# Patient Record
Sex: Female | Born: 1985 | Race: White | Hispanic: No | Marital: Married | Smoking: Never smoker
Health system: Southern US, Community
[De-identification: ages and names within clinical notes are randomized; demographics above are authoritative.]

## PROBLEM LIST (undated history)

## (undated) DIAGNOSIS — F419 Anxiety disorder, unspecified: Secondary | ICD-10-CM

## (undated) DIAGNOSIS — F329 Major depressive disorder, single episode, unspecified: Secondary | ICD-10-CM

## (undated) DIAGNOSIS — F32A Depression, unspecified: Secondary | ICD-10-CM

## (undated) DIAGNOSIS — N871 Moderate cervical dysplasia: Secondary | ICD-10-CM

## (undated) HISTORY — DX: Moderate cervical dysplasia: N87.1

---
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
## 2005-04-19 ENCOUNTER — Other Ambulatory Visit: Admission: RE | Admit: 2005-04-19 | Discharge: 2005-04-19 | Payer: Self-pay | Admitting: Gynecology

## 2006-05-14 ENCOUNTER — Other Ambulatory Visit: Admission: RE | Admit: 2006-05-14 | Discharge: 2006-05-14 | Payer: Self-pay | Admitting: Gynecology

## 2007-02-23 ENCOUNTER — Emergency Department (HOSPITAL_COMMUNITY): Admission: EM | Admit: 2007-02-23 | Discharge: 2007-02-23 | Payer: Self-pay | Admitting: Emergency Medicine

## 2007-08-01 ENCOUNTER — Other Ambulatory Visit: Admission: RE | Admit: 2007-08-01 | Discharge: 2007-08-01 | Payer: Self-pay | Admitting: Gynecology

## 2007-08-08 DIAGNOSIS — N871 Moderate cervical dysplasia: Secondary | ICD-10-CM

## 2007-08-08 HISTORY — DX: Moderate cervical dysplasia: N87.1

## 2007-08-18 HISTORY — PX: CERVICAL BIOPSY  W/ LOOP ELECTRODE EXCISION: SUR135

## 2007-12-01 ENCOUNTER — Other Ambulatory Visit: Admission: RE | Admit: 2007-12-01 | Discharge: 2007-12-01 | Payer: Self-pay | Admitting: Gynecology

## 2008-08-13 ENCOUNTER — Encounter: Payer: Self-pay | Admitting: Women's Health

## 2008-08-13 ENCOUNTER — Other Ambulatory Visit: Admission: RE | Admit: 2008-08-13 | Discharge: 2008-08-13 | Payer: Self-pay | Admitting: Gynecology

## 2008-08-13 ENCOUNTER — Ambulatory Visit: Payer: Self-pay | Admitting: Women's Health

## 2009-03-11 ENCOUNTER — Ambulatory Visit: Payer: Self-pay | Admitting: Gynecology

## 2009-04-29 ENCOUNTER — Ambulatory Visit: Payer: Self-pay | Admitting: Gynecology

## 2009-07-06 ENCOUNTER — Ambulatory Visit (HOSPITAL_COMMUNITY): Admission: RE | Admit: 2009-07-06 | Discharge: 2009-07-06 | Payer: Self-pay | Admitting: Obstetrics and Gynecology

## 2009-12-14 ENCOUNTER — Inpatient Hospital Stay (HOSPITAL_COMMUNITY): Admission: RE | Admit: 2009-12-14 | Discharge: 2009-12-16 | Payer: Self-pay | Admitting: Obstetrics and Gynecology

## 2010-01-23 HISTORY — PX: OTHER SURGICAL HISTORY: SHX169

## 2010-06-20 ENCOUNTER — Emergency Department (HOSPITAL_COMMUNITY)
Admission: EM | Admit: 2010-06-20 | Discharge: 2010-06-20 | Payer: Self-pay | Source: Home / Self Care | Admitting: Emergency Medicine

## 2010-07-26 ENCOUNTER — Emergency Department (HOSPITAL_COMMUNITY)
Admission: EM | Admit: 2010-07-26 | Discharge: 2010-07-26 | Disposition: A | Discharge status: Home / Self Care | Payer: BC Managed Care – PPO | Attending: Emergency Medicine | Admitting: Emergency Medicine

## 2010-07-26 DIAGNOSIS — R109 Unspecified abdominal pain: Secondary | ICD-10-CM | POA: Insufficient documentation

## 2010-09-04 LAB — COMPREHENSIVE METABOLIC PANEL
ALT: 124 U/L — ABNORMAL HIGH (ref 0–35)
AST: 64 U/L — ABNORMAL HIGH (ref 0–37)
Alkaline Phosphatase: 79 U/L (ref 39–117)
Calcium: 9.1 mg/dL (ref 8.4–10.5)
GFR calc Af Amer: >60 mL/min (ref >60)
Glucose, Bld: 103 mg/dL — ABNORMAL HIGH (ref 70–99)
Potassium: 3.5 mEq/L (ref 3.5–5.1)
Sodium: 141 mEq/L (ref 135–145)
Total Protein: 6 g/dL (ref 6.0–8.3)

## 2010-09-04 LAB — URINALYSIS, ROUTINE W REFLEX MICROSCOPIC
Glucose, UA: NEGATIVE mg/dL
Nitrite: NEGATIVE
pH: 7 (ref 5.0–8.0)

## 2010-09-04 LAB — DIFFERENTIAL
Basophils Relative: 0 % (ref 0–1)
Eosinophils Absolute: 0.2 10*3/uL (ref 0.0–0.7)
Eosinophils Relative: 3 % (ref 0–5)
Lymphs Abs: 1.7 10*3/uL (ref 0.7–4.0)
Monocytes Absolute: 0.6 10*3/uL (ref 0.1–1.0)
Monocytes Relative: 8 % (ref 3–12)
Neutrophils Relative %: 64 % (ref 43–77)

## 2010-09-04 LAB — CBC
HCT: 35 % — ABNORMAL LOW (ref 36.0–46.0)
Hemoglobin: 12 g/dL (ref 12.0–15.0)
MCHC: 34.3 g/dL (ref 30.0–36.0)
RDW: 12.4 % (ref 11.5–15.5)
WBC: 6.9 10*3/uL (ref 4.0–10.5)

## 2010-09-10 LAB — CCBB MATERNAL DONOR DRAW

## 2010-09-10 LAB — CBC
HCT: 33.2 % — ABNORMAL LOW (ref 36.0–46.0)
Hemoglobin: 11.5 g/dL — ABNORMAL LOW (ref 12.0–15.0)
MCH: 33.5 pg (ref 26.0–34.0)
MCHC: 34.8 g/dL (ref 30.0–36.0)
MCHC: 35.1 g/dL (ref 30.0–36.0)
MCV: 95.2 fL (ref 78.0–100.0)
MCV: 95.6 fL (ref 78.0–100.0)
Platelets: 162 10*3/uL (ref 150–400)
Platelets: 198 10*3/uL (ref 150–400)
RBC: 2.8 MIL/uL — ABNORMAL LOW (ref 3.87–5.11)
RDW: 12.6 % (ref 11.5–15.5)
RDW: 12.7 % (ref 11.5–15.5)

## 2010-11-10 NOTE — Discharge Summary (Signed)
NAMEJONEISHA, Katherine Quinn                         ACCOUNT NO.:  [id number]   MEDICAL RECORD NO.:  [id number]                   PATIENT TYPE:  INP   LOCATION:  9137                                 FACILITY:  [facility]   PHYSICIAN:  Timothy P. Fontaine, M.D.           DATE OF BIRTH:  Apr 11, 1986   DATE OF ADMISSION:  11/01/2003  DATE OF DISCHARGE:                                 DISCHARGE SUMMARY   DISCHARGE DIAGNOSIS:  Pregnancy at term, delivered.   PROCEDURE:  1. Spontaneous vaginal delivery on Nov 02, 2003.  2. Midline episiotomy with third degree extension, repaired, on Nov 02, 2003.   HOSPITAL COURSE:  A 24 year old G1 P0 at [redacted] weeks gestation who enters in  prodromal labor.  The patient spontaneously progressed to complete  dilatation, subsequently underwent a spontaneous vaginal delivery by Dr.  Lily Peer producing a normal female infant, Apgars of 8 and 8, weight 7  pounds 13 ounces.  The patient had a midline episiotomy with a third degree  extension which were repaired without difficulty.  She was discharged on  postpartum day #2 ambulating well, tolerating a regular diet.  Blood type is  O positive, rubella titer is immune.  Her postpartum hemoglobin is 11.3.  The patient received precautions, instructions, and follow-up, was seen in  the office in 6 weeks.  She received a prescription for Lortab 5.0 #15 for  pain.                                               Timothy P. Audie Box, M.D.    TPF/MEDQ  D:  11/04/2003  T:  11/04/2003  Job:  [id number]

## 2011-04-06 LAB — POCT PREGNANCY, URINE
Operator id: 294591
Preg Test, Ur: NEGATIVE

## 2011-04-06 LAB — URINALYSIS, ROUTINE W REFLEX MICROSCOPIC
Bilirubin Urine: NEGATIVE
Hgb urine dipstick: NEGATIVE
Specific Gravity, Urine: 1.022
Urobilinogen, UA: 1

## 2011-04-06 LAB — WET PREP, GENITAL: Yeast Wet Prep HPF POC: NONE SEEN

## 2011-04-06 LAB — URINE MICROSCOPIC-ADD ON

## 2011-04-06 LAB — GC/CHLAMYDIA PROBE AMP, GENITAL: GC Probe Amp, Genital: NEGATIVE

## 2011-05-20 ENCOUNTER — Emergency Department (HOSPITAL_COMMUNITY): Payer: BC Managed Care – PPO

## 2011-05-20 ENCOUNTER — Emergency Department (HOSPITAL_COMMUNITY)
Admission: EM | Admit: 2011-05-20 | Discharge: 2011-05-20 | Disposition: A | Discharge status: Home / Self Care | Payer: BC Managed Care – PPO | Attending: Emergency Medicine | Admitting: Emergency Medicine

## 2011-05-20 ENCOUNTER — Other Ambulatory Visit: Payer: Self-pay

## 2011-05-20 ENCOUNTER — Encounter (HOSPITAL_COMMUNITY): Payer: Self-pay | Admitting: Emergency Medicine

## 2011-05-20 DIAGNOSIS — H538 Other visual disturbances: Secondary | ICD-10-CM | POA: Insufficient documentation

## 2011-05-20 DIAGNOSIS — F29 Unspecified psychosis not due to a substance or known physiological condition: Secondary | ICD-10-CM | POA: Insufficient documentation

## 2011-05-20 DIAGNOSIS — R51 Headache: Secondary | ICD-10-CM | POA: Insufficient documentation

## 2011-05-20 DIAGNOSIS — H53149 Visual discomfort, unspecified: Secondary | ICD-10-CM | POA: Insufficient documentation

## 2011-05-20 DIAGNOSIS — E860 Dehydration: Secondary | ICD-10-CM | POA: Insufficient documentation

## 2011-05-20 DIAGNOSIS — R404 Transient alteration of awareness: Secondary | ICD-10-CM | POA: Insufficient documentation

## 2011-05-20 DIAGNOSIS — K8020 Calculus of gallbladder without cholecystitis without obstruction: Secondary | ICD-10-CM

## 2011-05-20 DIAGNOSIS — R42 Dizziness and giddiness: Secondary | ICD-10-CM | POA: Insufficient documentation

## 2011-05-20 DIAGNOSIS — J029 Acute pharyngitis, unspecified: Secondary | ICD-10-CM | POA: Insufficient documentation

## 2011-05-20 DIAGNOSIS — R1013 Epigastric pain: Secondary | ICD-10-CM | POA: Insufficient documentation

## 2011-05-20 DIAGNOSIS — R110 Nausea: Secondary | ICD-10-CM | POA: Insufficient documentation

## 2011-05-20 DIAGNOSIS — R61 Generalized hyperhidrosis: Secondary | ICD-10-CM | POA: Insufficient documentation

## 2011-05-20 DIAGNOSIS — R55 Syncope and collapse: Secondary | ICD-10-CM | POA: Insufficient documentation

## 2011-05-20 HISTORY — DX: Major depressive disorder, single episode, unspecified: F32.9

## 2011-05-20 HISTORY — DX: Depression, unspecified: F32.A

## 2011-05-20 HISTORY — DX: Anxiety disorder, unspecified: F41.9

## 2011-05-20 LAB — RAPID STREP SCREEN (MED CTR MEBANE ONLY): Streptococcus, Group A Screen (Direct): NEGATIVE

## 2011-05-20 LAB — POCT I-STAT TROPONIN I: Troponin i, poc: 0 ng/mL (ref 0.00–0.08)

## 2011-05-20 LAB — D-DIMER, QUANTITATIVE: D-Dimer, Quant: <0.22 ug/mL-FEU (ref 0.00–0.48)

## 2011-05-20 LAB — BASIC METABOLIC PANEL
BUN: 7 mg/dL (ref 6–23)
CO2: 24 mEq/L (ref 19–32)
Calcium: 8.9 mg/dL (ref 8.4–10.5)
Glucose, Bld: 88 mg/dL (ref 70–99)
Potassium: 3.7 mEq/L (ref 3.5–5.1)
Sodium: 135 mEq/L (ref 135–145)

## 2011-05-20 LAB — DIFFERENTIAL
Eosinophils Absolute: 0 10*3/uL (ref 0.0–0.7)
Eosinophils Relative: 0 % (ref 0–5)
Lymphs Abs: 0.4 10*3/uL — ABNORMAL LOW (ref 0.7–4.0)
Monocytes Absolute: 0.4 10*3/uL (ref 0.1–1.0)
Monocytes Relative: 8 % (ref 3–12)

## 2011-05-20 LAB — HEPATIC FUNCTION PANEL
ALT: 15 U/L (ref 0–35)
AST: 20 U/L (ref 0–37)
Albumin: 3.8 g/dL (ref 3.5–5.2)
Alkaline Phosphatase: 46 U/L (ref 39–117)
Bilirubin, Direct: <0.1 mg/dL (ref 0.0–0.3)
Total Bilirubin: 0.5 mg/dL (ref 0.3–1.2)
Total Protein: 6.8 g/dL (ref 6.0–8.3)

## 2011-05-20 LAB — URINALYSIS, ROUTINE W REFLEX MICROSCOPIC
Glucose, UA: NEGATIVE mg/dL
Hgb urine dipstick: NEGATIVE
Leukocytes, UA: NEGATIVE
Specific Gravity, Urine: 1.014 (ref 1.005–1.030)
pH: 7.5 (ref 5.0–8.0)

## 2011-05-20 LAB — CBC
Hemoglobin: 12.3 g/dL (ref 12.0–15.0)
MCH: 31 pg (ref 26.0–34.0)
MCV: 90.9 fL (ref 78.0–100.0)
RBC: 3.97 MIL/uL (ref 3.87–5.11)

## 2011-05-20 MED ORDER — ONDANSETRON HCL 4 MG/2ML IJ SOLN: 4.0000 mg | Freq: Four times a day (QID) | INTRAMUSCULAR | Status: DC | PRN

## 2011-05-20 MED ORDER — METOCLOPRAMIDE HCL 5 MG/ML IJ SOLN
10.0000 mg | Freq: Once | INTRAMUSCULAR | Status: AC
  Administered 2011-05-20: 10 mg via INTRAVENOUS
  Filled 2011-05-20: qty 2

## 2011-05-20 MED ORDER — SODIUM CHLORIDE 0.9 % IV SOLN
INTRAVENOUS | Status: DC
  Administered 2011-05-20: 13:00:00 via INTRAVENOUS

## 2011-05-20 MED ORDER — SODIUM CHLORIDE 0.9 % IV BOLUS (SEPSIS)
1500.0000 mL | Freq: Once | INTRAVENOUS | Status: AC
  Administered 2011-05-20: 1500 mL via INTRAVENOUS

## 2011-05-20 NOTE — ED Provider Notes (Signed)
History     CSN: [id number] Arrival date & time: 05/20/2011 11:15 AM   First MD Initiated Contact with Patient 05/20/11 1405      Chief Complaint  Patient presents with  . Loss of Consciousness    (Consider location/radiation/quality/duration/timing/severity/associated sxs/prior treatment) HPI  Patient states she felt fine today when she went to church. They were standing for about 10 minutes and she said everybody else felt warm but she felt cold so she put on her jacket she then starts sweating and felt dizzy and the fourchette she passed out. She states she woke up with some blurred vision and feeling shaky and feeling well confused. She relates as they were leaving church to go back to her parents house she started getting a frontal throbbing headache with photophobia but no noise sensitivity. By this point her blurred vision was gone. She also relates she started getting epigastric abdominal pain that feels like her prior gallbladder attacks. She has nausea without vomiting coughing or diarrhea. She states she had a mild sore throat yesterday. She's on her period now the she states she's never passed out before. She's been taking phentermine for the past 3 months this lost 15 pounds. She relates all she ate this morning was a [employer] breakfast bar. She states she has been on phentermine for a year at a time before. She states she knows she has gallstones and has had elevated liver tests before.  Psychiatrist Dr[facility] for the past 3 months  Past Medical History  Diagnosis Date  . Depressed   . Anxiety    gallstones  History reviewed. No pertinent past surgical history.  History reviewed. No pertinent family history.  History  Substance Use Topics  . Smoking status:  no   . Smokeless tobacco: Not on file  . Alcohol Use: No   lives with spouse  OB History    Grav Para Term Preterm Abortions TAB SAB Ect Mult Living                  Review of Systems    All other systems reviewed and are negative.    Allergies  Review of patient's allergies indicates no known allergies.  Home Medications   Current Outpatient Rx  Name Route Sig Dispense Refill  . DIAZEPAM 5 MG PO TABS Oral Take 5 mg by mouth every 6 (six) hours as needed.      Marland Kitchen FLUOXETINE HCL 10 MG PO CAPS Oral Take 10 mg by mouth daily.      Marland Kitchen PHENTERMINE HCL 15 MG PO CAPS Oral Take 15 mg by mouth every morning.        BP 100/60  Pulse 102  Temp(Src) 98.9 F (37.2 C) (Oral)  Resp 20  SpO2 97%  Vital signs normal except for tachycardia orthostatics show persistent tachycardia  Physical Exam  Vitals reviewed. Constitutional: She is oriented to person, place, and time. She appears well-developed and well-nourished.  Non-toxic appearance. She does not appear ill. No distress.  HENT:  Head: Normocephalic and atraumatic.  Right Ear: External ear normal.  Left Ear: External ear normal.  Nose: Nose normal. No mucosal edema or rhinorrhea.  Mouth/Throat: Uvula is midline and mucous membranes are normal. No dental abscesses or uvula swelling. No oropharyngeal exudate, posterior oropharyngeal edema, posterior oropharyngeal erythema or tonsillar abscesses.       Mucous membranes are dry  Eyes: Conjunctivae and EOM are normal. Pupils are equal, round, and reactive to light.  Neck: Normal range of motion and full passive range of motion without pain. Neck supple.  Cardiovascular: Normal rate, regular rhythm and normal heart sounds.  Exam reveals no gallop and no friction rub.   No murmur heard. Pulmonary/Chest: Effort normal and breath sounds normal. No respiratory distress. She has no wheezes. She has no rhonchi. She has no rales. She exhibits no tenderness and no crepitus.  Abdominal: Soft. Normal appearance and bowel sounds are normal. She exhibits no distension. There is no tenderness. There is no rebound and no guarding.  Musculoskeletal: Normal range of motion. She exhibits no  edema and no tenderness.       Moves all extremities well.   Neurological: She is alert and oriented to person, place, and time. She has normal strength. No cranial nerve deficit.  Skin: Skin is warm, dry and intact. No rash noted. No erythema. No pallor.  Psychiatric: Her speech is normal and behavior is normal. Her mood appears not anxious.       Affect is flat    ED Course  Procedures (including critical care time)  Patient was given 1500 cc fluid bolus for her dehydration, tachycardia, and orthostasis. She was given Reglan and Benadryl IV. She relates her abdominal pain and headaches are improved.   Results for orders placed during the hospital encounter of 05/20/11  BASIC METABOLIC PANEL      Component Value Range   Sodium 135  135 - 145 (mEq/L)   Potassium 3.7  3.5 - 5.1 (mEq/L)   Chloride 102  96 - 112 (mEq/L)   CO2 24  19 - 32 (mEq/L)   Glucose, Bld 88  70 - 99 (mg/dL)   BUN 7  6 - 23 (mg/dL)   Creatinine, Ser 1.61  0.50 - 1.10 (mg/dL)   Calcium 8.9  8.4 - 09.6 (mg/dL)   GFR calc non Af Amer >90  >90 (mL/min)   GFR calc Af Amer >90  >90 (mL/min)  CBC      Component Value Range   WBC 5.2  4.0 - 10.5 (K/uL)   RBC 3.97  3.87 - 5.11 (MIL/uL)   Hemoglobin 12.3  12.0 - 15.0 (g/dL)   HCT 04.5  40.9 - 81.1 (%)   MCV 90.9  78.0 - 100.0 (fL)   MCH 31.0  26.0 - 34.0 (pg)   MCHC 34.1  30.0 - 36.0 (g/dL)   RDW 91.4  78.2 - 95.6 (%)   Platelets 117 (*) 150 - 400 (K/uL)  DIFFERENTIAL      Component Value Range   Neutrophils Relative 83 (*) 43 - 77 (%)   Neutro Abs 4.3  1.7 - 7.7 (K/uL)   Lymphocytes Relative 8 (*) 12 - 46 (%)   Lymphs Abs 0.4 (*) 0.7 - 4.0 (K/uL)   Monocytes Relative 8  3 - 12 (%)   Monocytes Absolute 0.4  0.1 - 1.0 (K/uL)   Eosinophils Relative 0  0 - 5 (%)   Eosinophils Absolute 0.0  0.0 - 0.7 (K/uL)   Basophils Relative 0  0 - 1 (%)   Basophils Absolute 0.0  0.0 - 0.1 (K/uL)  URINALYSIS, ROUTINE W REFLEX MICROSCOPIC      Component Value Range   Color,  Urine YELLOW  YELLOW    Appearance CLEAR  CLEAR    Specific Gravity, Urine 1.014  1.005 - 1.030    pH 7.5  5.0 - 8.0    Glucose, UA NEGATIVE  NEGATIVE (mg/dL)   Hgb urine  dipstick NEGATIVE  NEGATIVE    Bilirubin Urine NEGATIVE  NEGATIVE    Ketones, ur NEGATIVE  NEGATIVE (mg/dL)   Protein, ur NEGATIVE  NEGATIVE (mg/dL)   Urobilinogen, UA 1.0  0.0 - 1.0 (mg/dL)   Nitrite NEGATIVE  NEGATIVE    Leukocytes, UA NEGATIVE  NEGATIVE   D-DIMER, QUANTITATIVE      Component Value Range   D-Dimer, Quant <0.22  0.00 - 0.48 (ug/mL-FEU)  RAPID STREP SCREEN      Component Value Range   Streptococcus, Group A Screen (Direct) NEGATIVE  NEGATIVE   GLUCOSE, CAPILLARY      Component Value Range   Glucose-Capillary 78  70 - 99 (mg/dL)  POCT I-STAT TROPONIN I      Component Value Range   Troponin i, poc 0.00  0.00 - 0.08 (ng/mL)   Comment 3           HEPATIC FUNCTION PANEL      Component Value Range   Total Protein 6.8  6.0 - 8.3 (g/dL)   Albumin 3.8  3.5 - 5.2 (g/dL)   AST 20  0 - 37 (U/L)   ALT 15  0 - 35 (U/L)   Alkaline Phosphatase 46  39 - 117 (U/L)   Total Bilirubin 0.5  0.3 - 1.2 (mg/dL)   Bilirubin, Direct <1.6  0.0 - 0.3 (mg/dL)   Indirect Bilirubin NOT CALCULATED  0.3 - 0.9 (mg/dL)  LIPASE, BLOOD      Component Value Range   Lipase 17  11 - 59 (U/L)   Laboratory impression normal  Dg Chest 2 View  05/20/2011  *RADIOLOGY REPORT*  Clinical Data: 25 year old female with sweats and chills.  Syncope.  CHEST - 2 VIEW  Comparison: None  Findings: The cardiomediastinal silhouette is unremarkable. The lungs are clear. There is no evidence of focal airspace disease, pulmonary edema, pulmonary nodule/mass, pleural effusion, or pneumothorax. No acute bony abnormalities are identified.  IMPRESSION: No evidence of active cardiopulmonary disease.  Original Report Authenticated By: Rosendo Gros, M.D.   Ct Head Wo Contrast  05/20/2011  *RADIOLOGY REPORT*  Clinical Data: 26 year old female with  syncope, headache and blurred vision.  CT HEAD WITHOUT CONTRAST  Technique:  Contiguous axial images were obtained from the base of the skull through the vertex without contrast.  Comparison: None  Findings: No intracranial abnormalities are identified, including mass lesion or mass effect, hydrocephalus, extra-axial fluid collection, midline shift, hemorrhage, or acute infarction.  The visualized bony calvarium is unremarkable.  IMPRESSION: Unremarkable noncontrast head CT  Original Report Authenticated By: Rosendo Gros, M.D.     Date: 05/20/2011  Rate: 90  Rhythm: normal sinus rhythm  QRS Axis: normal  Intervals: normal  ST/T Wave abnormalities: nonspecific T wave changes  Conduction Disutrbances:none  Narrative Interpretation:   Old EKG Reviewed: none available    1. Syncope   2. Headache   3. Gallstones   4. Dehydration     Plan discharge  MDM          Ward Givens, MD 05/20/11 1600

## 2011-05-20 NOTE — ED Notes (Signed)
Pt. Stated, we were singing in church and I passed out . When i woke up up I had a headache and my stomach hurt really bad

## 2011-05-20 NOTE — ED Notes (Signed)
amb to the bathroom for urine sample

## 2011-05-20 NOTE — ED Provider Notes (Signed)
25yo F, c/o one episode of brief syncope while standing at church P[employer].  States she felt "cold" then "sweaty" and lightheaded.  States "next thing I remember everyone was asking me if I was ok."  Has had recent mild sore throat.  No other complaints.  NAD, A&O, resps easy, CTA, RRR, abd soft/NT, neuro non-focal.  I saw this patient for screening purposes.  Pt is stable, but further workup and evaluation is needed beyond triage capabilities.   Laray Anger, DO 05/20/11 1203

## 2011-05-21 LAB — POCT PREGNANCY, URINE: Preg Test, Ur: NEGATIVE

## 2011-06-12 ENCOUNTER — Encounter (HOSPITAL_COMMUNITY): Payer: Self-pay | Admitting: Emergency Medicine

## 2011-06-12 ENCOUNTER — Emergency Department (HOSPITAL_COMMUNITY)
Admission: EM | Admit: 2011-06-12 | Discharge: 2011-06-12 | Disposition: A | Discharge status: Home / Self Care | Payer: BC Managed Care – PPO | Attending: Emergency Medicine | Admitting: Emergency Medicine

## 2011-06-12 DIAGNOSIS — T424X4A Poisoning by benzodiazepines, undetermined, initial encounter: Secondary | ICD-10-CM | POA: Insufficient documentation

## 2011-06-12 DIAGNOSIS — N390 Urinary tract infection, site not specified: Secondary | ICD-10-CM | POA: Insufficient documentation

## 2011-06-12 DIAGNOSIS — T43502A Poisoning by unspecified antipsychotics and neuroleptics, intentional self-harm, initial encounter: Secondary | ICD-10-CM | POA: Insufficient documentation

## 2011-06-12 DIAGNOSIS — T438X2A Poisoning by other psychotropic drugs, intentional self-harm, initial encounter: Secondary | ICD-10-CM | POA: Insufficient documentation

## 2011-06-12 DIAGNOSIS — IMO0002 Reserved for concepts with insufficient information to code with codable children: Secondary | ICD-10-CM

## 2011-06-12 DIAGNOSIS — F341 Dysthymic disorder: Secondary | ICD-10-CM | POA: Insufficient documentation

## 2011-06-12 DIAGNOSIS — Z79899 Other long term (current) drug therapy: Secondary | ICD-10-CM | POA: Insufficient documentation

## 2011-06-12 LAB — DIFFERENTIAL
Basophils Absolute: 0 10*3/uL (ref 0.0–0.1)
Basophils Relative: 0 % (ref 0–1)
Eosinophils Relative: 1 % (ref 0–5)
Lymphocytes Relative: 25 % (ref 12–46)
Monocytes Absolute: 0.7 10*3/uL (ref 0.1–1.0)

## 2011-06-12 LAB — CBC
MCHC: 35 g/dL (ref 30.0–36.0)
MCV: 89.3 fL (ref 78.0–100.0)
Platelets: 172 10*3/uL (ref 150–400)
RDW: 12.2 % (ref 11.5–15.5)
WBC: 8.4 10*3/uL (ref 4.0–10.5)

## 2011-06-12 LAB — RAPID URINE DRUG SCREEN, HOSP PERFORMED
Amphetamines: NOT DETECTED
Benzodiazepines: POSITIVE — AB
Cocaine: NOT DETECTED
Opiates: NOT DETECTED

## 2011-06-12 LAB — URINALYSIS, ROUTINE W REFLEX MICROSCOPIC
Bilirubin Urine: NEGATIVE
Glucose, UA: NEGATIVE mg/dL
Specific Gravity, Urine: 1.007 (ref 1.005–1.030)
Urobilinogen, UA: 1 mg/dL (ref 0.0–1.0)
pH: 6 (ref 5.0–8.0)

## 2011-06-12 LAB — URINE MICROSCOPIC-ADD ON

## 2011-06-12 LAB — ETHANOL: Alcohol, Ethyl (B): <11 mg/dL (ref 0–11)

## 2011-06-12 LAB — COMPREHENSIVE METABOLIC PANEL
ALT: 9 U/L (ref 0–35)
AST: 10 U/L (ref 0–37)
Albumin: 3.4 g/dL — ABNORMAL LOW (ref 3.5–5.2)
CO2: 25 mEq/L (ref 19–32)
Calcium: 8.3 mg/dL — ABNORMAL LOW (ref 8.4–10.5)
Creatinine, Ser: 0.66 mg/dL (ref 0.50–1.10)
GFR calc non Af Amer: >90 mL/min (ref >90)
Sodium: 137 mEq/L (ref 135–145)
Total Protein: 5.8 g/dL — ABNORMAL LOW (ref 6.0–8.3)

## 2011-06-12 LAB — ACETAMINOPHEN LEVEL: Acetaminophen (Tylenol), Serum: <15 ug/mL (ref 10–30)

## 2011-06-12 LAB — PREGNANCY, URINE: Preg Test, Ur: NEGATIVE

## 2011-06-12 MED ORDER — NITROFURANTOIN MONOHYD MACRO 100 MG PO CAPS: 100.0000 mg | ORAL_CAPSULE | Freq: Two times a day (BID) | ORAL | Status: AC

## 2011-06-12 NOTE — ED Notes (Signed)
WUJ:WJXB<JY> Expected date:06/12/11<BR> Expected time:12:51 AM<BR> Means of arrival:Ambulance<BR> Comments:<BR> EMS [facility] GC, 25 yof OD on valium, domestic

## 2011-06-12 NOTE — ED Provider Notes (Signed)
History     CSN: [id number] Arrival date & time: 06/12/2011  1:18 AM   First MD Initiated Contact with Patient 06/12/11 0206      Chief Complaint  Patient presents with  . Drug Overdose    pt sattes she took approx 15 pills    (Consider location/radiation/quality/duration/timing/severity/associated sxs/prior treatment) The history is provided by the patient.   She states that she was with her husband when she took 61. Valium pills in anger at him. They have been going through separation and ultimately will divorce based on infideility.  When seen in the emergency department, she was remorseful, nonsuicidal, and cooperative. During the time before I saw her she did not vomit, was not sleeping, and had normal vital signs. Patient has never had a prior suicide attempt. She is seeing a psychiatrist regularly for depression. She is currently living with a young child, her husband, and working daily. She has not had any recent illness, headache, back pain, difficulty walking.  Past Medical History  Diagnosis Date  . Depressed   . Anxiety     No past surgical history on file.  No family history on file.  History  Substance Use Topics  . Smoking status: Not on file  . Smokeless tobacco: Not on file  . Alcohol Use: No    OB History    Grav Para Term Preterm Abortions TAB SAB Ect Mult Living                  Review of Systems  All other systems reviewed and are negative.    Allergies  Review of patient's allergies indicates no known allergies.  Home Medications   Current Outpatient Rx  Name Route Sig Dispense Refill  . DIAZEPAM 5 MG PO TABS Oral Take 5 mg by mouth every 6 (six) hours as needed. Anxiety    . FLUOXETINE HCL 10 MG PO CAPS Oral Take 10 mg by mouth daily.     Marland Kitchen PHENTERMINE HCL 15 MG PO CAPS Oral Take 15 mg by mouth every morning.      Marland Kitchen NITROFURANTOIN MONOHYD MACRO 100 MG PO CAPS Oral Take 1 capsule (100 mg total) by mouth 2 (two) times daily. 10 capsule 0      BP 100/57  Pulse 76  Temp(Src) 98.6 F (37 C) (Oral)  Resp 16  SpO2 100%  Physical Exam  Constitutional: She is oriented to person, place, and time. She appears well-developed and well-nourished.  HENT:  Head: Normocephalic and atraumatic.  Eyes: Conjunctivae are normal. Pupils are equal, round, and reactive to light.  Neck: Normal range of motion. Neck supple.  Cardiovascular: Normal rate and regular rhythm.   Pulmonary/Chest: Effort normal and breath sounds normal.  Abdominal: Soft. Bowel sounds are normal.  Musculoskeletal: Normal range of motion.  Neurological: She is alert and oriented to person, place, and time. No cranial nerve deficit. She exhibits normal muscle tone. Coordination normal.  Skin: Skin is warm and dry.  Psychiatric: She has a normal mood and affect. Her behavior is normal. Judgment and thought content normal.    ED Course  Procedures (including critical care time) The patient was asked to, and agreed to sign a no harm contract. She states that she will be going to work today and will follow up with her psychiatrist.  Labs Reviewed  CBC - Abnormal; Notable for the following:    RBC 3.55 (*)    Hemoglobin 11.1 (*)    HCT 31.7 (*)  All other components within normal limits  COMPREHENSIVE METABOLIC PANEL - Abnormal; Notable for the following:    Potassium 3.0 (*)    Calcium 8.3 (*)    Total Protein 5.8 (*)    Albumin 3.4 (*)    All other components within normal limits  URINALYSIS, ROUTINE W REFLEX MICROSCOPIC - Abnormal; Notable for the following:    APPearance CLOUDY (*)    Hgb urine dipstick SMALL (*)    Nitrite POSITIVE (*)    Leukocytes, UA LARGE (*)    All other components within normal limits  URINE RAPID DRUG SCREEN (HOSP PERFORMED) - Abnormal; Notable for the following:    Benzodiazepines POSITIVE (*)    All other components within normal limits  SALICYLATE LEVEL - Abnormal; Notable for the following:    Salicylate Lvl <2.0 (*)     All other components within normal limits  URINE MICROSCOPIC-ADD ON - Abnormal; Notable for the following:    Squamous Epithelial / LPF MANY (*)    Bacteria, UA MANY (*)    All other components within normal limits  DIFFERENTIAL  PREGNANCY, URINE  ETHANOL  ACETAMINOPHEN LEVEL  URINE CULTURE   No results found.   1. Drug ingestion   2. UTI (lower urinary tract infection)       MDM  Nontoxic ingestion, related to social stress. Patient is nonsuicidal in the emergency department. She has access to followup care with her psychiatrist. She has incidental urinary tract infection that can be treated as an outpatient.        Flint Melter, MD 06/12/11 [phone]

## 2011-06-12 NOTE — ED Notes (Signed)
Pt states that she found a number in her husband's pants and she drove to the address to find husband at a female's house. Pt states at that time she was suicidal and wanted to hurt herself. Pt states she took approx 15 diazepam's. Pt states "it was just I was thinking clearly when I saw him there, I don't want to kill myself, I have my children to live for". Pt clothes, medicines, and other belongings removed and placed at nursing station.

## 2011-06-14 LAB — URINE CULTURE: Culture  Setup Time: 201212180959

## 2011-06-15 NOTE — ED Notes (Signed)
+   urine Patient treated with Macrobid-sensitive to same-chart appended per protocol MD. 

## 2011-10-26 ENCOUNTER — Other Ambulatory Visit (HOSPITAL_COMMUNITY)
Admission: RE | Admit: 2011-10-26 | Discharge: 2011-10-26 | Disposition: A | Discharge status: Home / Self Care | Payer: BC Managed Care – PPO | Source: Ambulatory Visit | Attending: Gynecology | Admitting: Gynecology

## 2011-10-26 ENCOUNTER — Ambulatory Visit (INDEPENDENT_AMBULATORY_CARE_PROVIDER_SITE_OTHER): Payer: BC Managed Care – PPO | Admitting: Gynecology

## 2011-10-26 ENCOUNTER — Encounter: Payer: Self-pay | Admitting: Gynecology

## 2011-10-26 VITALS — BP 110/68 | Ht 61.0 in | Wt 137.0 lb

## 2011-10-26 DIAGNOSIS — N926 Irregular menstruation, unspecified: Secondary | ICD-10-CM

## 2011-10-26 DIAGNOSIS — Z01419 Encounter for gynecological examination (general) (routine) without abnormal findings: Secondary | ICD-10-CM | POA: Insufficient documentation

## 2011-10-26 DIAGNOSIS — M79602 Pain in left arm: Secondary | ICD-10-CM

## 2011-10-26 DIAGNOSIS — N921 Excessive and frequent menstruation with irregular cycle: Secondary | ICD-10-CM

## 2011-10-26 DIAGNOSIS — Z975 Presence of (intrauterine) contraceptive device: Secondary | ICD-10-CM

## 2011-10-26 DIAGNOSIS — M79609 Pain in unspecified limb: Secondary | ICD-10-CM

## 2011-10-26 DIAGNOSIS — N938 Other specified abnormal uterine and vaginal bleeding: Secondary | ICD-10-CM

## 2011-10-26 LAB — CBC WITH DIFFERENTIAL/PLATELET
Eosinophils Relative: 2 % (ref 0–5)
HCT: 37.5 % (ref 36.0–46.0)
Lymphocytes Relative: 40 % (ref 12–46)
Lymphs Abs: 2 10*3/uL (ref 0.7–4.0)
MCV: 92.8 fL (ref 78.0–100.0)
Monocytes Absolute: 0.4 10*3/uL (ref 0.1–1.0)
RBC: 4.04 MIL/uL (ref 3.87–5.11)
RDW: 11.9 % (ref 11.5–15.5)
WBC: 5 10*3/uL (ref 4.0–10.5)

## 2011-10-26 NOTE — Progress Notes (Signed)
Katherine Quinn 02/05/1986 [id number]   History:    26 y.o.  for annual exam who in 2011 after the birth of her last child she had an implant on placed on her left arm in another practice. She states she feels discomfort and that arm which she's leaning on and but no numbness and tingling reported. 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 She frequently does her self breast examination. Next week as scheduled to undergo breast augmentation in several months later abdominoplasty. She's also complaining of irregular periods since she's had the implant on. She did have the Mirena IUD several years ago worked well for her with the exception of some mild cramping.  Past medical history,surgical history, family history and social history were all reviewed and documented in the EPIC chart.  Gynecologic History Patient's last menstrual period was 10/13/2011. Contraception: Nexplanon Last Pap: 2012?Marland Kitchen Results were: normal Last mammogram: Not indicated. Results were: Not indicated  Obstetric History OB History    Grav Para Term Preterm Abortions TAB SAB Ect Mult Living   2 2 2       2      # Outc Date GA Lbr Len/2nd Wgt Sex Del Anes PTL Lv   1 TRM     F SVD  No Yes   2 TRM     M SVD  No Yes       ROS:  Was performed and pertinent positives and negatives are included in the history.  Exam: chaperone present  BP 110/68  Ht 5\' 1"  (1.549 m)  Wt 137 lb (62.143 kg)  BMI 25.89 kg/m2  LMP 10/13/2011  Body mass index is 25.89 kg/(m^2).  General appearance : Well developed well nourished female. No acute distress HEENT: Neck supple, trachea midline, no carotid bruits, no thyroidmegaly Lungs: Clear to auscultation, no rhonchi or wheezes, or rib retractions  Heart: Regular rate and rhythm, no murmurs or gallops Breast:Examined in sitting and supine position were symmetrical in  appearance, no palpable masses or tenderness,  no skin retraction, no nipple inversion, no nipple discharge, no skin discoloration, no axillary or supraclavicular lymphadenopathy Abdomen: no palpable masses or tenderness, no rebound or guarding Extremities: no edema or skin discoloration or tenderness, Nexplanon capsule was noted to be placed subdermally but more posteriorly than usual. Good motor and sensory deficit and pulses on that arm.  Pelvic:  Bartholin, Urethra, Skene Glands: Within normal limits             Vagina: No gross lesions or discharge  Cervix: No gross lesions or discharge  Uterus  anteverted, normal size, shape and consistency, non-tender and mobile  Adnexa  Without masses or tenderness  Anus and perineum  normal   Rectovaginal  normal sphincter tone without palpated masses or tenderness             Hemoccult not done     Assessment/Plan:  26 y.o. female for annual exam with irregular bleeding on the nexplanon and complaining of discomfort on that arm. She will return back to the office in 2 weeks to have the nexplanon removed and we'll replace with an IUD "Skyla" which is good for 3 years. The risks benefits and pros and cons were discussed. She was encouraged to continue to do monthly self breast examination. Her CBC cholesterol urinalysis and Pap smear was done today.    Ok Edwards MD,  9:53 AM 10/26/2011

## 2011-10-26 NOTE — Patient Instructions (Signed)
Intrauterine Device Information An intrauterine device (IUD) is inserted into your uterus and prevents pregnancy. There are 2 types of IUDs available:  Copper IUD. This type of IUD is wrapped in copper wire and is placed inside the uterus. Copper makes the uterus and fallopian tubes produce a fluid that kills sperm. The copper IUD can stay in place for 10 years.   Hormone IUD. This type of IUD contains the hormone progestin (synthetic progesterone). The hormone thickens the cervical mucus and prevents sperm from entering the uterus, and it also thins the uterine lining to prevent implantation of a fertilized egg. The hormone can weaken or kill the sperm that get into the uterus. The hormone IUD can stay in place for 5 years.  Your caregiver will make sure you are a good candidate for a contraceptive IUD. Discuss with your caregiver the possible side effects. ADVANTAGES  It is highly effective, reversible, long-acting, and low maintenance.   There are no estrogen-related side effects.   An IUD can be used when breastfeeding.   It is not associated with weight gain.   It works immediately after insertion.   The copper IUD does not interfere with your female hormones.   The progesterone IUD can make heavy menstrual periods lighter.   The progesterone IUD can be used for 5 years.   The copper IUD can be used for 10 years.  DISADVANTAGES  The progesterone IUD can be associated with irregular bleeding patterns.   The copper IUD can make your menstrual flow heavier and more painful.   You may experience cramping and vaginal bleeding after insertion.  Document Released: 05/15/2004 Document Revised: 05/31/2011 Document Reviewed: 10/14/2010 [employer] Patient Information 2012 [employer]. 

## 2011-10-29 ENCOUNTER — Telehealth: Payer: Self-pay | Admitting: *Deleted

## 2011-10-29 ENCOUNTER — Other Ambulatory Visit: Payer: Self-pay | Admitting: *Deleted

## 2011-10-29 DIAGNOSIS — Z3049 Encounter for surveillance of other contraceptives: Secondary | ICD-10-CM

## 2011-10-29 MED ORDER — LEVONORGESTREL 13.5 MG IU IUD: INTRAUTERINE_SYSTEM | Freq: Once | INTRAUTERINE | Status: DC

## 2011-10-29 NOTE — Telephone Encounter (Signed)
Message copied by Libby Maw on Mon Oct 29, 2011 10:25 AM ------      Message from: Ok Edwards      Created: Fri Oct 26, 2011  9:57 AM       Any, patients going to return to the office in 2 weeks to remove the nexplanon that was placed in another facility. She would like an IUD placed at the same time. I've recommended the Skyla IUD. Please check her insurance verification and have one saved here for her.

## 2011-10-29 NOTE — Telephone Encounter (Signed)
Patient informed all below covered at 100%.

## 2011-11-15 ENCOUNTER — Ambulatory Visit: Payer: BC Managed Care – PPO | Admitting: Gynecology

## 2011-11-16 ENCOUNTER — Ambulatory Visit: Payer: BC Managed Care – PPO | Admitting: Gynecology

## 2012-01-17 ENCOUNTER — Ambulatory Visit: Payer: BC Managed Care – PPO | Admitting: Gynecology

## 2012-03-04 ENCOUNTER — Telehealth: Payer: Self-pay | Admitting: *Deleted

## 2012-03-04 NOTE — Telephone Encounter (Signed)
Pt was called today regarding follow up on IUD that was ordered for her. She has a implanon that needs to be removed and then wanted a IUD ineserted. I left a message for pt to call me back KW

## 2012-03-07 NOTE — Telephone Encounter (Signed)
Pt hasnt called back so I will close encounter today KW

## 2012-08-10 IMAGING — CT CT HEAD W/O CM
1 of 2 series · 13 of 30 positions shown, 17 images · non-contrast
Comparison: None

CLINICAL DATA: 25-year-old female with syncope, headache and
blurred vision.

CT HEAD WITHOUT CONTRAST
TECHNIQUE: Contiguous axial images were obtained from the base of
the skull through the vertex without contrast.

[Series 2: brain · axial · 0.47mm/px · z∈[+83,+204]mm · 13 of 28 slices shown, 17 images]
[im 2/28  brain]
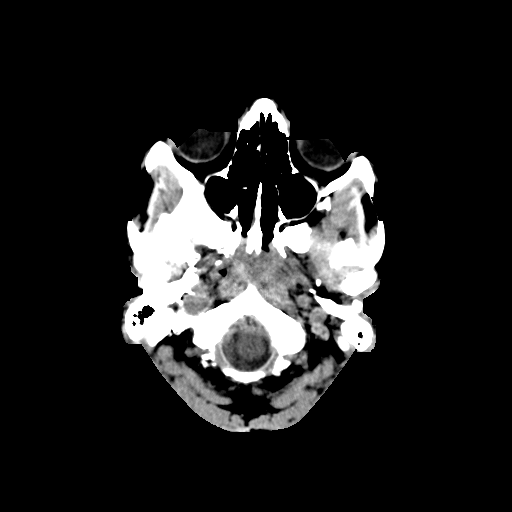
[im 2/28  bone]
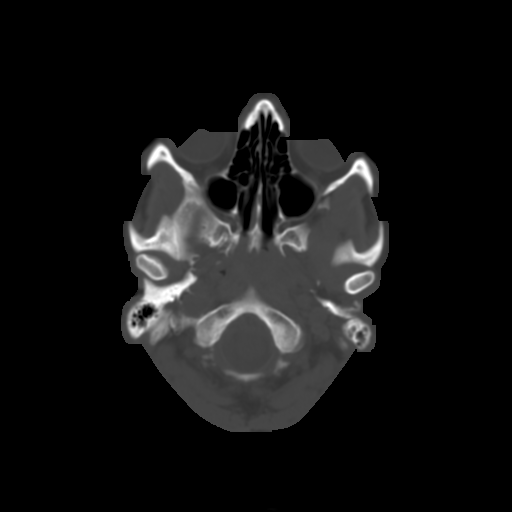
[im 4/28  brain]
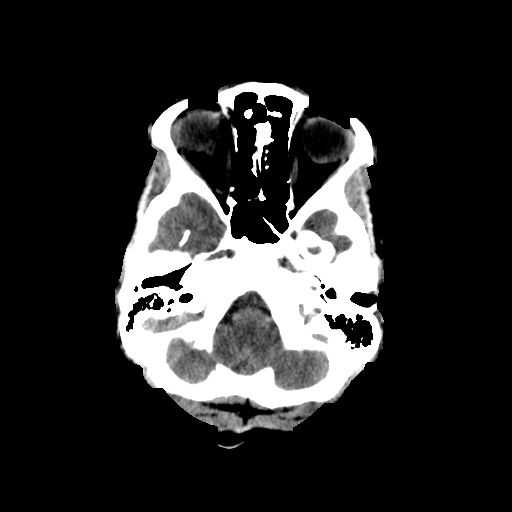
[im 6/28  brain]
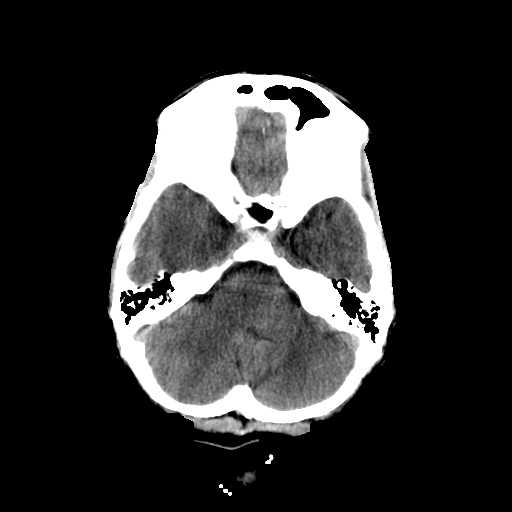
[im 8/28  brain]
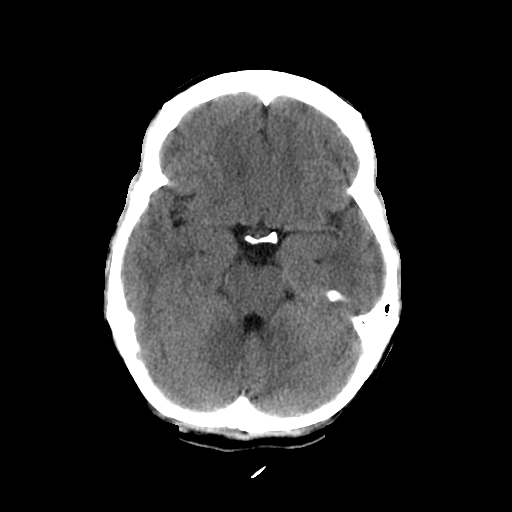
[im 10/28  brain]
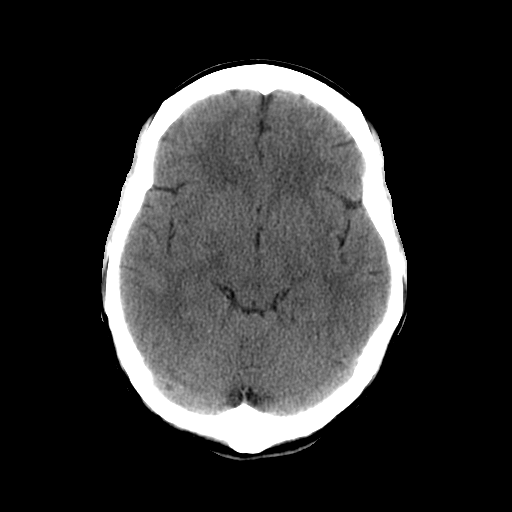
[im 10/28  bone]
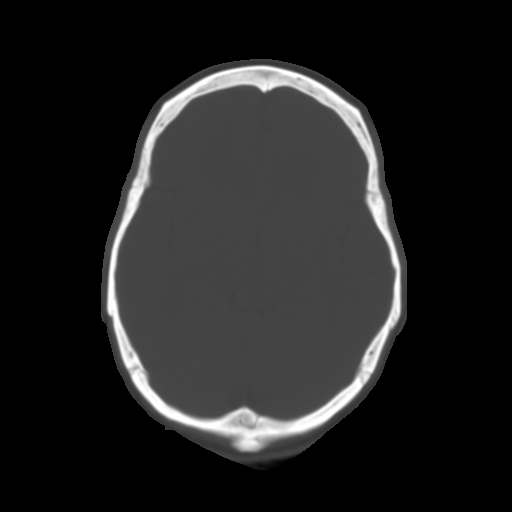
[im 12/28  brain]
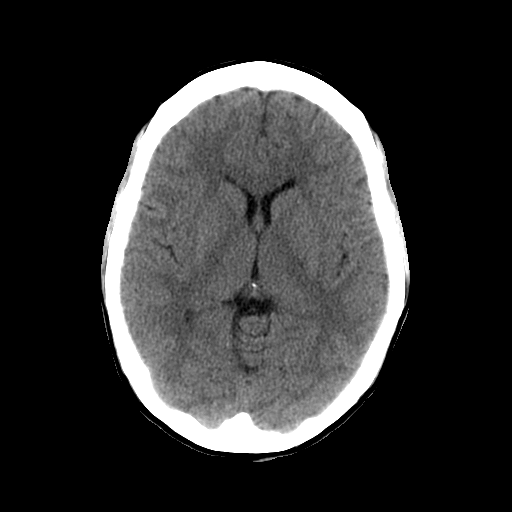
[im 14/28  brain]
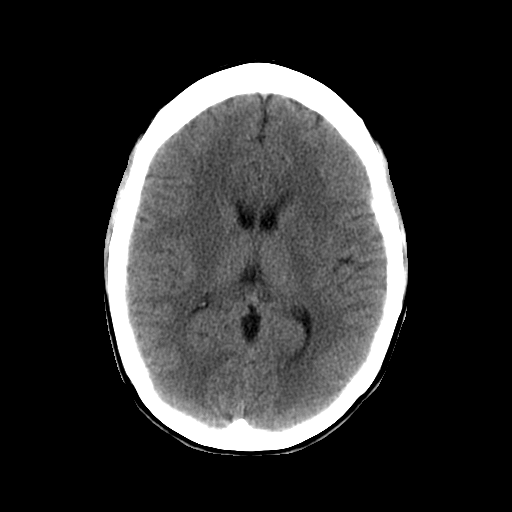
[im 16/28  brain]
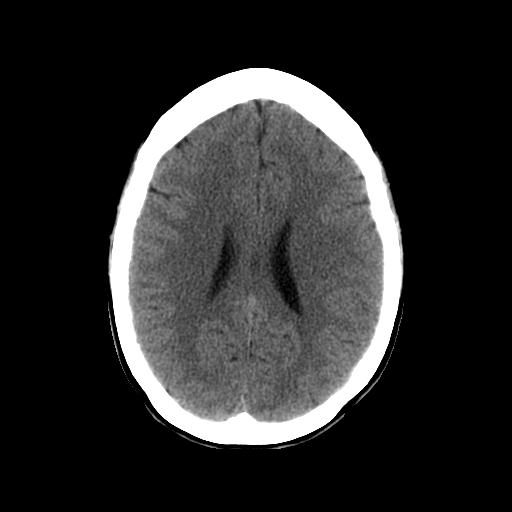
[im 18/28  brain]
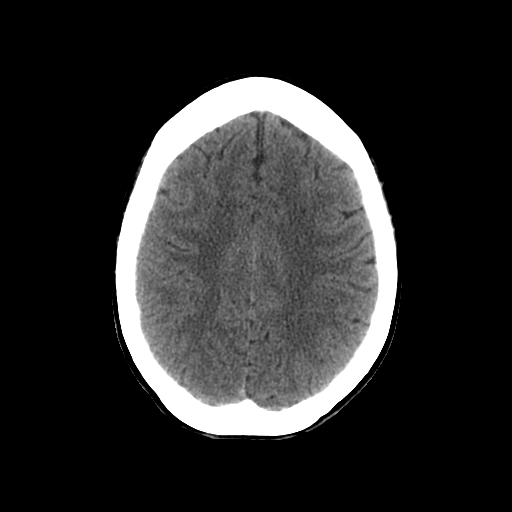
[im 18/28  bone]
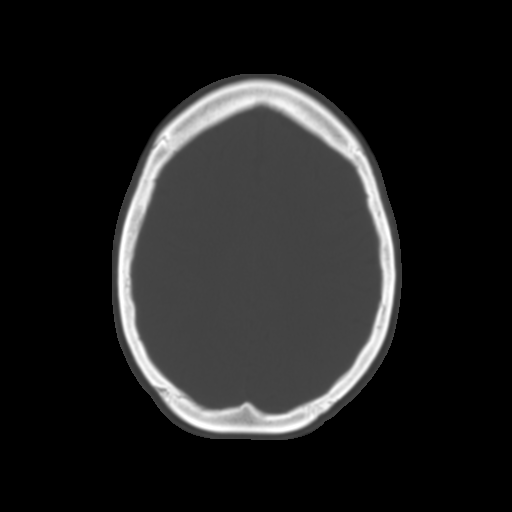
[im 20/28  brain]
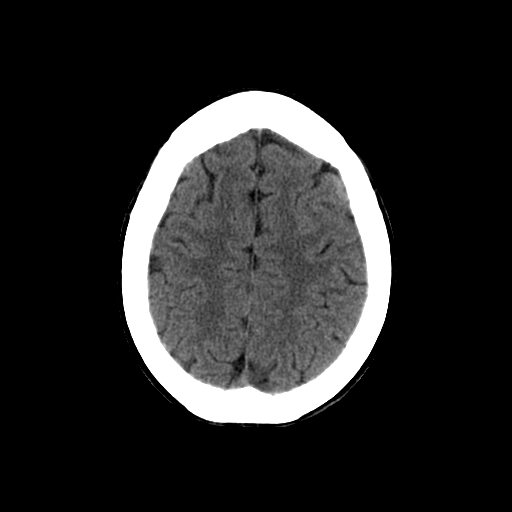
[im 22/28  brain]
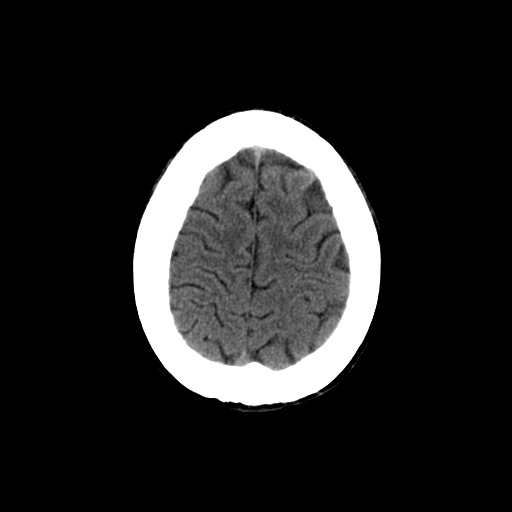
[im 24/28  brain]
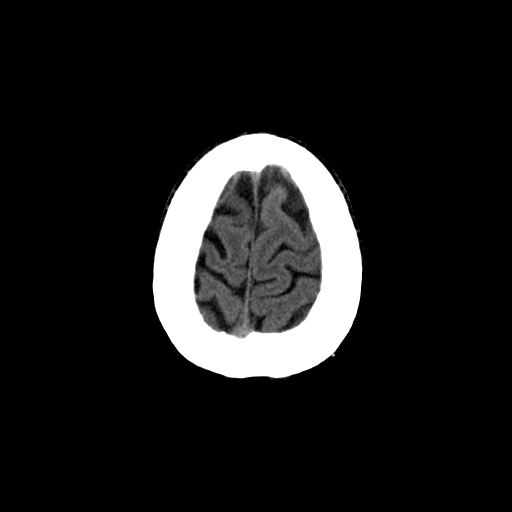
[im 26/28  brain]
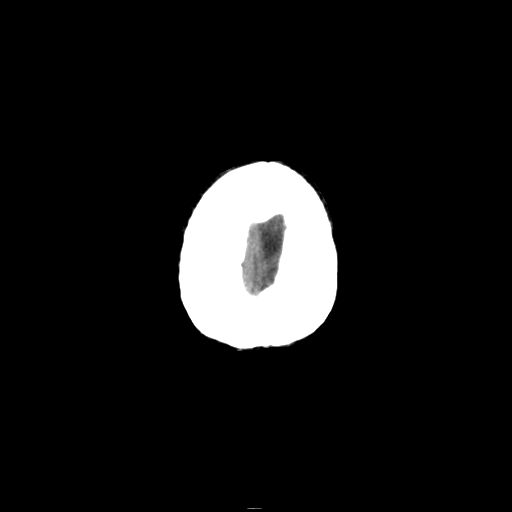
[im 26/28  bone]
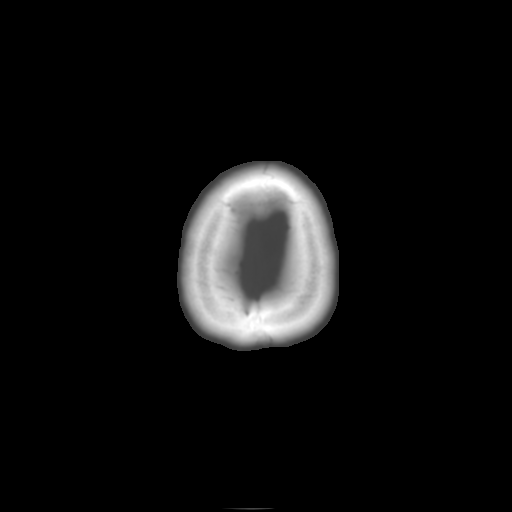

[13 of 30 positions shown; findings below may reference images not displayed]

FINDINGS: No intracranial abnormalities are identified, including
mass lesion or mass effect, hydrocephalus, extra-axial fluid
collection, midline shift, hemorrhage, or acute infarction.

The visualized bony calvarium is unremarkable.
IMPRESSION: Unremarkable noncontrast head CT

## 2013-01-20 ENCOUNTER — Encounter: Payer: Self-pay | Admitting: Gynecology

## 2013-01-21 ENCOUNTER — Encounter: Payer: Self-pay | Admitting: Gynecology

## 2013-02-02 ENCOUNTER — Encounter: Payer: Self-pay | Admitting: Gynecology

## 2013-02-02 ENCOUNTER — Other Ambulatory Visit (HOSPITAL_COMMUNITY)
Admission: RE | Admit: 2013-02-02 | Discharge: 2013-02-02 | Disposition: A | Discharge status: Home / Self Care | Payer: BC Managed Care – PPO | Source: Ambulatory Visit | Attending: Gynecology | Admitting: Gynecology

## 2013-02-02 ENCOUNTER — Ambulatory Visit (INDEPENDENT_AMBULATORY_CARE_PROVIDER_SITE_OTHER): Payer: BC Managed Care – PPO | Admitting: Gynecology

## 2013-02-02 VITALS — BP 110/70 | Ht 61.0 in | Wt 131.0 lb

## 2013-02-02 DIAGNOSIS — Z113 Encounter for screening for infections with a predominantly sexual mode of transmission: Secondary | ICD-10-CM

## 2013-02-02 DIAGNOSIS — Z01419 Encounter for gynecological examination (general) (routine) without abnormal findings: Secondary | ICD-10-CM | POA: Insufficient documentation

## 2013-02-02 DIAGNOSIS — Z23 Encounter for immunization: Secondary | ICD-10-CM

## 2013-02-02 DIAGNOSIS — F419 Anxiety disorder, unspecified: Secondary | ICD-10-CM

## 2013-02-02 DIAGNOSIS — F411 Generalized anxiety disorder: Secondary | ICD-10-CM

## 2013-02-02 DIAGNOSIS — Z87410 Personal history of cervical dysplasia: Secondary | ICD-10-CM | POA: Insufficient documentation

## 2013-02-02 DIAGNOSIS — Z124 Encounter for screening for malignant neoplasm of cervix: Secondary | ICD-10-CM

## 2013-02-02 LAB — CBC WITH DIFFERENTIAL/PLATELET
Eosinophils Absolute: 0.1 10*3/uL (ref 0.0–0.7)
Eosinophils Relative: 2 % (ref 0–5)
Hemoglobin: 12.3 g/dL (ref 12.0–15.0)
Lymphs Abs: 1.3 10*3/uL (ref 0.7–4.0)
MCH: 31.4 pg (ref 26.0–34.0)
MCV: 92.9 fL (ref 78.0–100.0)
Monocytes Relative: 13 % — ABNORMAL HIGH (ref 3–12)
RBC: 3.92 MIL/uL (ref 3.87–5.11)

## 2013-02-02 LAB — CHOLESTEROL, TOTAL: Cholesterol: 132 mg/dL (ref 0–200)

## 2013-02-02 NOTE — Patient Instructions (Addendum)

## 2013-02-02 NOTE — Addendum Note (Signed)
Addended by: Richardson Chiquito on: 02/02/2013 12:56 PM   Modules accepted: Orders

## 2013-02-02 NOTE — Progress Notes (Signed)
Katherine SHINGLEDECKER [birth year] [id number]   History:    27 y.o.  for annual gyn exam with no complaints. Patient stated that at another medical facility she had a Nexplanon  approximately 3 years ago and wanted to have it removed and place a Mirena IUD in the near future. Patient reports her menstrual cycles have always been slightly irregular since she had the Nexplanon placed. The patient recently had saline implants placed. 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 She frequently does her self breast examination.   Past medical history,surgical history, family history and social history were all reviewed and documented in the EPIC chart.  Gynecologic History Patient's last menstrual period was 01/22/2013. Contraception: Nexplanon Last Pap: 2013. Results were: normal Last mammogram: none indicated. Results were: that indicated  Obstetric History OB History   Grav Para Term Preterm Abortions TAB SAB Ect Mult Living   2 2 2       2      # Outc Date GA Lbr Len/2nd Wgt Sex Del Anes PTL Lv   1 TRM     F SVD  No Yes   2 TRM     M SVD  No Yes       ROS: A ROS was performed and pertinent positives and negatives are included in the history.  GENERAL: No fevers or chills. HEENT: No change in vision, no earache, sore throat or sinus congestion. NECK: No pain or stiffness. CARDIOVASCULAR: No chest pain or pressure. No palpitations. PULMONARY: No shortness of breath, cough or wheeze. GASTROINTESTINAL: No abdominal pain, nausea, vomiting or diarrhea, melena or bright red blood per rectum. GENITOURINARY: No urinary frequency, urgency, hesitancy or dysuria. MUSCULOSKELETAL: No joint or muscle pain, no back pain, no recent trauma. DERMATOLOGIC: No rash, no itching, no lesions. ENDOCRINE: No polyuria, polydipsia, no heat or cold intolerance. No recent change in weight. HEMATOLOGICAL: No anemia  or easy bruising or bleeding. NEUROLOGIC: No headache, seizures, numbness, tingling or weakness. PSYCHIATRIC: No depression, no loss of interest in normal activity or change in sleep pattern.     Exam: chaperone present  BP 110/70  Ht 5\' 1"  (1.549 m)  Wt 131 lb (59.421 kg)  BMI 24.76 kg/m2  LMP 01/22/2013  Body mass index is 24.76 kg/(m^2).  General appearance : Well developed well nourished female. No acute distress HEENT: Neck supple, trachea midline, no carotid bruits, no thyroidmegaly Lungs: Clear to auscultation, no rhonchi or wheezes, or rib retractions  Heart: Regular rate and rhythm, no murmurs or gallops Breast:Examined in sitting and supine position were symmetrical in appearance, no palpable masses or tenderness,  no skin retraction, no nipple inversion, no nipple discharge, no skin discoloration, no axillary or supraclavicular lymphadenopathy,saline implants Abdomen: no palpable masses or tenderness, no rebound or guarding Extremities: no edema or skin discoloration or tenderness  Pelvic:  Bartholin, Urethra, Skene Glands: Within normal limits             Vagina: No gross lesions or discharge  Cervix: No gross lesions or discharge  Uterus  anteverted, normal size, shape and consistency, non-tender and mobile  Adnexa  Without masses or tenderness  Anus and perineum  normal   Rectovaginal  normal sphincter tone without palpated masses or tenderness             Hemoccult none indicated     Assessment/Plan:  27 y.o. female for  annual exam who will make an appointment to return back to the office in the next couple weeks to have her Nexplanon removed and placement of a Mirena IUD. The patient had good experience in the past with a Mirena IUD. She will have a CBC, urinalysis and Pap smear obtained today. The new guidelines were discussed but due to the fact that she had CIN-2 in the past we will continue to do Pap smears every year. She was reminded to do her monthly breast  exams. She received the Tdap vaccine today. Literature information was provided.patient takes Xanax 0.25 mg daily only on a when necessary basis for anxiety.    Ok Edwards MD, 12:22 PM 02/02/2013

## 2013-02-03 ENCOUNTER — Other Ambulatory Visit: Payer: Self-pay | Admitting: Gynecology

## 2013-02-03 ENCOUNTER — Encounter: Payer: Self-pay | Admitting: Gynecology

## 2013-02-03 ENCOUNTER — Telehealth: Payer: Self-pay | Admitting: Gynecology

## 2013-02-03 DIAGNOSIS — Z3049 Encounter for surveillance of other contraceptives: Secondary | ICD-10-CM

## 2013-02-03 LAB — URINALYSIS W MICROSCOPIC + REFLEX CULTURE
Bilirubin Urine: NEGATIVE
Casts: NONE SEEN
Crystals: NONE SEEN
Ketones, ur: NEGATIVE mg/dL
Nitrite: NEGATIVE
Specific Gravity, Urine: 1.018 (ref 1.005–1.030)
pH: 6 (ref 5.0–8.0)

## 2013-02-03 LAB — GC/CHLAMYDIA PROBE AMP: CT Probe RNA: NEGATIVE

## 2013-02-03 MED ORDER — LEVONORGESTREL 20 MCG/24HR IU IUD: INTRAUTERINE_SYSTEM | Freq: Once | INTRAUTERINE | Status: AC

## 2013-02-03 NOTE — Telephone Encounter (Signed)
02/03/13-LM VM for pt that her BC ins covers the removal of Implanon & insertion of Mirena at 100%, no copay. Pt has appt already with JF for 08/26/14WL

## 2013-02-04 ENCOUNTER — Other Ambulatory Visit: Payer: Self-pay | Admitting: Gynecology

## 2013-02-04 ENCOUNTER — Telehealth: Payer: Self-pay

## 2013-02-04 DIAGNOSIS — R3129 Other microscopic hematuria: Secondary | ICD-10-CM

## 2013-02-04 LAB — URINE CULTURE: Organism ID, Bacteria: NO GROWTH

## 2013-02-04 MED ORDER — FLUCONAZOLE 150 MG PO TABS: 150.0000 mg | ORAL_TABLET | Freq: Once | ORAL | Status: DC

## 2013-02-17 ENCOUNTER — Ambulatory Visit (INDEPENDENT_AMBULATORY_CARE_PROVIDER_SITE_OTHER): Payer: BC Managed Care – PPO | Admitting: Gynecology

## 2013-02-17 ENCOUNTER — Encounter: Payer: Self-pay | Admitting: Gynecology

## 2013-02-17 VITALS — BP 118/70

## 2013-02-17 DIAGNOSIS — Z3046 Encounter for surveillance of implantable subdermal contraceptive: Secondary | ICD-10-CM

## 2013-02-17 DIAGNOSIS — R3129 Other microscopic hematuria: Secondary | ICD-10-CM

## 2013-02-17 DIAGNOSIS — N390 Urinary tract infection, site not specified: Secondary | ICD-10-CM

## 2013-02-17 DIAGNOSIS — Z30430 Encounter for insertion of intrauterine contraceptive device: Secondary | ICD-10-CM

## 2013-02-17 LAB — URINALYSIS W MICROSCOPIC + REFLEX CULTURE
Bilirubin Urine: NEGATIVE
Casts: NONE SEEN
Crystals: NONE SEEN
Glucose, UA: NEGATIVE mg/dL
Specific Gravity, Urine: >1.03 — ABNORMAL HIGH (ref 1.005–1.030)
Urobilinogen, UA: 0.2 mg/dL (ref 0.0–1.0)
pH: 5.5 (ref 5.0–8.0)

## 2013-02-17 MED ORDER — FLUCONAZOLE 150 MG PO TABS: 150.0000 mg | ORAL_TABLET | Freq: Once | ORAL | Status: DC

## 2013-02-17 MED ORDER — NITROFURANTOIN MONOHYD MACRO 100 MG PO CAPS: 100.0000 mg | ORAL_CAPSULE | Freq: Two times a day (BID) | ORAL | Status: DC

## 2013-02-17 NOTE — Patient Instructions (Addendum)
Urinary Tract Infection  Urinary tract infections (UTIs) can develop anywhere along your urinary tract. Your urinary tract is your body's drainage system for removing wastes and extra water. Your urinary tract includes two kidneys, two ureters, a bladder, and a urethra. Your kidneys are a pair of bean-shaped organs. Each kidney is about the size of your fist. They are located below your ribs, one on each side of your spine.  CAUSES  Infections are caused by microbes, which are microscopic organisms, including fungi, viruses, and bacteria. These organisms are so small that they can only be seen through a microscope. Bacteria are the microbes that most commonly cause UTIs.  SYMPTOMS   Symptoms of UTIs may vary by age and gender of the patient and by the location of the infection. Symptoms in young women typically include a frequent and intense urge to urinate and a painful, burning feeling in the bladder or urethra during urination. Older women and men are more likely to be tired, shaky, and weak and have muscle aches and abdominal pain. A fever may mean the infection is in your kidneys. Other symptoms of a kidney infection include pain in your back or sides below the ribs, nausea, and vomiting.  DIAGNOSIS  To diagnose a UTI, your caregiver will ask you about your symptoms. Your caregiver also will ask to provide a urine sample. The urine sample will be tested for bacteria and white blood cells. White blood cells are made by your body to help fight infection.  TREATMENT   Typically, UTIs can be treated with medication. Because most UTIs are caused by a bacterial infection, they usually can be treated with the use of antibiotics. The choice of antibiotic and length of treatment depend on your symptoms and the type of bacteria causing your infection.  HOME CARE INSTRUCTIONS   If you were prescribed antibiotics, take them exactly as your caregiver instructs you. Finish the medication even if you feel better after you  have only taken some of the medication.   Drink enough water and fluids to keep your urine clear or pale yellow.   Avoid caffeine, tea, and carbonated beverages. They tend to irritate your bladder.   Empty your bladder often. Avoid holding urine for long periods of time.   Empty your bladder before and after sexual intercourse.   After a bowel movement, women should cleanse from front to back. Use each tissue only once.  SEEK MEDICAL CARE IF:    You have back pain.   You develop a fever.   Your symptoms do not begin to resolve within 3 days.  SEEK IMMEDIATE MEDICAL CARE IF:    You have severe back pain or lower abdominal pain.   You develop chills.   You have nausea or vomiting.   You have continued burning or discomfort with urination.  MAKE SURE YOU:    Understand these instructions.   Will watch your condition.   Will get help right away if you are not doing well or get worse.  Document Released: 03/21/2005 Document Revised: 12/11/2011 Document Reviewed: 07/20/2011  Exit[employer] Patient Information 2014 [employer].

## 2013-02-17 NOTE — Progress Notes (Signed)
Patient presented to the office today to remove her overdue Nexplanon  Contraceptive implant from her left arm. Patient has stated that she is having a lot of breakthrough bleeding and wanted to have the Mirena IUD placed instead like she had in the past that worked well for her. Patient on last annual exam was noted to have many bacteria but her culture was negative and she was asked to repeat her urinalysis today which indicated the following:  WBC 21-50 RBC 11-28 Bacteria many Clue cells and yeast                           Nexplanon procedure note (removal)  The patient presented to the office today requesting for removal of her Nexplanon that was placed in the year 2011 on her left arm by another physician.  On examination the nexplanon implant was palpated and the distal end  (end  closest to the elbow) was marked. The area was sterilized with Betadine solution. 1% lidocaine was used for local anesthesia and approximately 1 cc  was injected into the site that was marked where  the incision was to be made. The local anesthetic was injected under the implant in an effort to keep it  close to the skin surface. Slight pressure pushing downward was made at the proximal end  of the implant in an effort to stabilize it. A bulge appeared indicating the distal end of the implant. Starting at the distal tip of the implant, a small longitudinal incision of 2 mm was made towards the elbow. By gently pushing the implant toward the incision the tip became visible. Grasping the implant with a curved forcep facilitated in gently removing the implant. Full confirmation of the entire implant which is 4 cm long was inspected and was intact and was shown to the patient and discarded. After removing the implant, the incision was closed with a Steri-Strip and applying and adhesive Kerlix bandage. Patient will be instructed to remove the pressure bandage in 24 hours and the Steri-Strips in 3-5 days.                                                                             IUD procedure note                 Patient presented to the office today for placement of Mirena IUD. The patient had previously been provided with literature information on this method of contraception. The risks benefits and pros and cons were discussed and all her questions were answered. She is fully aware that this form of contraception is 99% effective and is good for 5 years.  Pelvic exam: Bartholin urethra Skene glands: Within normal limits Vagina: No lesions or discharge Cervix: No lesions or discharge Uterus: anteverted position Adnexa: No masses or tenderness Rectal exam: Not done  The cervix was cleansed with Betadine solution. A single-tooth tenaculum was placed on the anterior cervical lip. The uterus sounded to 7 centimeter. The IUD was shown to the patient and inserted in a sterile fashion. The IUD string was trimmed. The single-tooth tenaculum was removed. Patient was instructed to return back to the office in  one month for follow up.       Assessment/plan: Removal of Nexplanon subdermal implant and replace with Mirena IUD for contraception. Patient will return back in one month for followup. For her urinary tract infection she will be placed on Macrobid one by mouth twice a day for 7 days. For that yeast that was noted in her urine she will be prescribed Diflucan 150 mg 1 by mouth daily. Patient had brought up to my attention that she has had approximately 3 UTIs per year. When she returns in one month for followup on her IUD we'll recheck her urine if she continues having evidence of bacteria in her urine she will be referred to the urologist for further evaluation.

## 2013-02-18 ENCOUNTER — Encounter: Payer: Self-pay | Admitting: Gynecology

## 2013-02-19 LAB — URINE CULTURE

## 2013-03-23 ENCOUNTER — Ambulatory Visit: Payer: BC Managed Care – PPO | Admitting: Gynecology

## 2013-04-28 ENCOUNTER — Encounter: Payer: Self-pay | Admitting: Gynecology

## 2013-04-28 ENCOUNTER — Ambulatory Visit (INDEPENDENT_AMBULATORY_CARE_PROVIDER_SITE_OTHER): Payer: BC Managed Care – PPO

## 2013-04-28 ENCOUNTER — Other Ambulatory Visit: Payer: Self-pay | Admitting: Gynecology

## 2013-04-28 ENCOUNTER — Ambulatory Visit (INDEPENDENT_AMBULATORY_CARE_PROVIDER_SITE_OTHER): Payer: BC Managed Care – PPO | Admitting: Gynecology

## 2013-04-28 VITALS — BP 112/70

## 2013-04-28 DIAGNOSIS — N831 Corpus luteum cyst of ovary, unspecified side: Secondary | ICD-10-CM

## 2013-04-28 DIAGNOSIS — Z30431 Encounter for routine checking of intrauterine contraceptive device: Secondary | ICD-10-CM

## 2013-04-28 NOTE — Progress Notes (Signed)
Patient presented to the office for IUD check after having had the Mirena IUD placed 02/17/2013 and at the same time had had her Nexplanon removed. Patient states her cycles are now more regular and is doing well otherwise.  Exam: Bartholin's urethra Skene was within normal limits Vagina: No lesions or discharge Cervix: IUD string not seen Uterus: Anteverted normal size shape and consistency Adnexa: No palpable mass or tenderness Rectal exam: Not done  An ultrasound was done today in an effort to determine that the IUD was in its proper position findings as follows:  Uterus measures 9.3 x 5.3 x 3.9 cm with endometrial stripe of 2.6 mm. Left ovary normal. Small right corpus luteum cyst measuring 23 x 25 x 16 mm was noted. No fluid in the cul-de-sac.  The patient was reassured of the findings on ultrasound IUD in proper position. The cyst is the physiological ovulatory cysts no further workup needed at this time. Patient otherwise scheduled to return to the office next year for her annual exam or when necessary.

## 2014-04-26 ENCOUNTER — Encounter: Payer: Self-pay | Admitting: Gynecology

## 2015-11-05 ENCOUNTER — Emergency Department
Admission: EM | Admit: 2015-11-05 | Discharge: 2015-11-06 | Disposition: A | Discharge status: Home / Self Care | Payer: BLUE CROSS/BLUE SHIELD | Attending: Emergency Medicine | Admitting: Emergency Medicine

## 2015-11-05 ENCOUNTER — Emergency Department: Payer: BLUE CROSS/BLUE SHIELD

## 2015-11-05 ENCOUNTER — Encounter: Payer: Self-pay | Admitting: Emergency Medicine

## 2015-11-05 DIAGNOSIS — R1011 Right upper quadrant pain: Secondary | ICD-10-CM | POA: Diagnosis present

## 2015-11-05 DIAGNOSIS — Z79899 Other long term (current) drug therapy: Secondary | ICD-10-CM | POA: Diagnosis not present

## 2015-11-05 DIAGNOSIS — K8020 Calculus of gallbladder without cholecystitis without obstruction: Secondary | ICD-10-CM | POA: Insufficient documentation

## 2015-11-05 DIAGNOSIS — F329 Major depressive disorder, single episode, unspecified: Secondary | ICD-10-CM | POA: Diagnosis not present

## 2015-11-05 DIAGNOSIS — R1010 Upper abdominal pain, unspecified: Secondary | ICD-10-CM

## 2015-11-05 LAB — COMPREHENSIVE METABOLIC PANEL
ALK PHOS: 64 U/L (ref 38–126)
ALT: 87 U/L — AB (ref 14–54)
AST: 81 U/L — ABNORMAL HIGH (ref 15–41)
Albumin: 3.8 g/dL (ref 3.5–5.0)
Anion gap: 5 (ref 5–15)
BUN: 25 mg/dL — ABNORMAL HIGH (ref 6–20)
CALCIUM: 8.6 mg/dL — AB (ref 8.9–10.3)
CO2: 26 mmol/L (ref 22–32)
CREATININE: 0.68 mg/dL (ref 0.44–1.00)
Chloride: 107 mmol/L (ref 101–111)
GFR calc non Af Amer: >60 mL/min (ref >60)
Glucose, Bld: 93 mg/dL (ref 65–99)
Potassium: 3.9 mmol/L (ref 3.5–5.1)
Sodium: 138 mmol/L (ref 135–145)
TOTAL PROTEIN: 6.9 g/dL (ref 6.5–8.1)
Total Bilirubin: 0.6 mg/dL (ref 0.3–1.2)

## 2015-11-05 LAB — CBC
HCT: 35.6 % (ref 35.0–47.0)
Hemoglobin: 11.9 g/dL — ABNORMAL LOW (ref 12.0–16.0)
MCH: 32 pg (ref 26.0–34.0)
MCHC: 33.5 g/dL (ref 32.0–36.0)
MCV: 95.5 fL (ref 80.0–100.0)
PLATELETS: 206 10*3/uL (ref 150–440)
RBC: 3.73 MIL/uL — AB (ref 3.80–5.20)
RDW: 12.4 % (ref 11.5–14.5)
WBC: 9 10*3/uL (ref 3.6–11.0)

## 2015-11-05 LAB — POCT PREGNANCY, URINE: PREG TEST UR: NEGATIVE

## 2015-11-05 LAB — LIPASE, BLOOD: Lipase: 22 U/L (ref 11–51)

## 2015-11-05 MED ORDER — TRAMADOL HCL 50 MG PO TABS: 50.0000 mg | ORAL_TABLET | Freq: Four times a day (QID) | ORAL | Status: AC | PRN

## 2015-11-05 MED ORDER — ONDANSETRON HCL 4 MG PO TABS: 4.0000 mg | ORAL_TABLET | Freq: Three times a day (TID) | ORAL | Status: AC | PRN

## 2015-11-05 MED ORDER — ONDANSETRON HCL 4 MG/2ML IJ SOLN
4.0000 mg | Freq: Once | INTRAMUSCULAR | Status: AC
  Administered 2015-11-05: 4 mg via INTRAVENOUS

## 2015-11-05 MED ORDER — KETOROLAC TROMETHAMINE 30 MG/ML IJ SOLN
15.0000 mg | Freq: Once | INTRAMUSCULAR | Status: AC
  Administered 2015-11-05: 15 mg via INTRAVENOUS

## 2015-11-05 NOTE — ED Provider Notes (Signed)
Time Seen: Approximately 2200  I have reviewed the triage notes  Chief Complaint: Abdominal Pain   History of Present Illness: Katherine Quinn is a 30 y.o. female who presents with subacute onset of some right-sided upper abdominal pain. Patient states she has a known history of biliary colic and usually is able to get her pain under control with over-the-counter ibuprofen. She states the pain started actually early this morning and has been intermittent with exacerbations occurring into this evening. She denies any fever at home. She's had some nausea but no persistent vomiting. She states it does occasionally hurt her to take a deep breath. She states usually her biliary colic pain is more toward the epigastric area. She denies any productive cough or wheezing. She denies any calf tenderness or swelling or any obvious pulmonary emboli risk factors.   Past Medical History  Diagnosis Date  . Depressed   . Anxiety   . CIN II (cervical intraepithelial neoplasia II) 08/08/2007    Patient Active Problem List   Diagnosis Date Noted  . Anxiety 02/02/2013  . History of cervical dysplasia 02/02/2013    Past Surgical History  Procedure Laterality Date  . Cervical biopsy  w/ loop electrode excision  08/18/2007    MARGINS FREE  CIN II  . Nexplanon insertion  01/2010    Past Surgical History  Procedure Laterality Date  . Cervical biopsy  w/ loop electrode excision  08/18/2007    MARGINS FREE  CIN II  . Nexplanon insertion  01/2010    Current Outpatient Rx  Name  Route  Sig  Dispense  Refill  . ALPRAZolam (XANAX) 1 MG tablet   Oral   Take 1 mg by mouth at bedtime as needed.         Marland Kitchen amphetamine-dextroamphetamine (ADDERALL) 30 MG tablet   Oral   Take 1 tablet by mouth See admin instructions. Take 1 tablet in the morning, 1 tablet at noon and 0.5 tablet at 4 pm      0     Allergies:  Review of patient's allergies indicates no known allergies.  Family History: Family History   Problem Relation Age of Onset  . Heart disease Maternal Grandfather     Social History: Social History  Substance Use Topics  . Smoking status: Never Smoker   . Smokeless tobacco: Never Used  . Alcohol Use: No     Review of Systems:   10 point review of systems was performed and was otherwise negative:  Constitutional: No fever Eyes: No visual disturbances ENT: No sore throat, ear pain Cardiac: No chest pain Respiratory: No shortness of breath, wheezing, or stridorShe does have pleuritic pain Abdomen: Right upper quadrant abdominal pain with radiation to the right sided flank and up into the right shoulder Endocrine: No weight loss, No night sweats Extremities: No peripheral edema, cyanosis Skin: No rashes, easy bruising Neurologic: No focal weakness, trouble with speech or swollowing Urologic: No dysuria, Hematuria, or urinary frequency   Physical Exam:  ED Triage Vitals  Enc Vitals Group     BP 11/05/15 1810 118/73 mmHg     Pulse Rate 11/05/15 1810 110     Resp 11/05/15 1810 16     Temp 11/05/15 1810 98.6 F (37 C)     Temp Source 11/05/15 1810 Oral     SpO2 11/05/15 1810 100 %     Weight --      Height --      Head Cir --  Peak Flow --      Pain Score 11/05/15 1814 7     Pain Loc --      Pain Edu? --      Excl. in GC? --     General: Awake , Alert , and Oriented times 3; GCS 15 Head: Normal cephalic , atraumatic Eyes: Pupils equal , round, reactive to light Nose/Throat: No nasal drainage, patent upper airway without erythema or exudate.  Neck: Supple, Full range of motion, No anterior adenopathy or palpable thyroid masses Lungs: Clear to ascultation without wheezes , rhonchi, or rales Heart: Regular rate, regular rhythm without murmurs , gallops , or rubs Abdomen: Reproducible pain in the right upper quadrant lateral to Murphy's point. She does have some reproducible pain toward her right flank area. There is no peritoneal signs. Bowel sounds are  positive and symmetric in all 4 quadrants. No reproducible lower abdominal pain.       Extremities: 2 plus symmetric pulses. No edema, clubbing or cyanosis Neurologic: normal ambulation, Motor symmetric without deficits, sensory intact Skin: warm, dry, no rashes   Labs:   All laboratory work was reviewed including any pertinent negatives or positives listed below:  Labs Reviewed  COMPREHENSIVE METABOLIC PANEL - Abnormal; Notable for the following:    BUN 25 (*)    Calcium 8.6 (*)    AST 81 (*)    ALT 87 (*)    All other components within normal limits  CBC - Abnormal; Notable for the following:    RBC 3.73 (*)    Hemoglobin 11.9 (*)    All other components within normal limits  LIPASE, BLOOD  URINALYSIS COMPLETEWITH MICROSCOPIC ([facility] ONLY)  POC URINE PREG, ED  POCT PREGNANCY, URINE    Radiology: *    Final result by Rad Results In Interface (11/05/15 22:50:11)   Narrative:   CLINICAL DATA: Upper abdominal pain  EXAM: US ABDOMEN LIMITED - RIGHT UPPER QUADRANT  COMPARISON: Ultrasound 06/20/2010  FINDINGS: Gallbladder:  Contracted gallbladder. Gallstone in the neck of the gallbladder. Multiple gallstones are seen on the prior ultrasound 2011. Gallbladder wall thickened due to lack of distention.  Common bile duct:  Diameter: 1.2 mm  Liver:  No liver lesions. Hyperechoic lesion left lobe liver seen previously no longer visualized. This may be due to different scanning conditions. No free fluid in the right upper quadrant  IMPRESSION: Contracted gallbladder. Gallstone is present in the gallbladder neck. Multiple small stones seen on the prior ultrasound.  Hyperechoic liver lesion is no longer visualized on today's study.   Electronically Signed By: Marlan Palau M.D. On: 11/05/2015 22:50      I personally reviewed the radiologic studies    ED Course: * Patient's differential includes all causes for her primarily epigastric abdominal  pain Differential diagnosis includes but is not exclusive to acute cholecystitis, intrathoracic causes for epigastric abdominal pain, gastritis, duodenitis, pancreatitis, small bowel or large bowel obstruction, abdominal aortic aneurysm, hernia, gastritis, etc. Given her past history this is most likely some biliary colic and does have a gallstone present in the gallbladder neck and this time. Her blood cell count is normal and she is afebrile. LFTs are within normal limits. Patient was given IV Toradol and IV Zofran for pain control   Assessment: * Acute biliary colic   Final Clinical Impression:   Final diagnoses:  Upper abdominal pain     Plan:  Patient will be examinedpain has improved she'll be discharged with prescription pain medication and referral to  general surgery. If pain is not controlled and we will contact Gen. surgery for overnight admission and possible cholecystectomy.           Jennye MoccasinBrian S Quigley, MD 11/05/15 [id number]

## 2015-11-05 NOTE — ED Notes (Signed)
C/O RUQ abdominal pain. Onset of symptoms yesterday.  States today while out shopping began to sweat really badly and felt sharp pains to RUQ.

## 2015-11-05 NOTE — ED Notes (Signed)
Pt reports being diagnosed with Gallstones in 2008, with a recommendation for gallbladder removal. Pt reports being awoken from sleep last night in severe pain, which later dissipated. Pt reports at approx 17:45 today, having intense pain in URQ of abdomen with accompanying nausea, SOB, and diaphoresis.

## 2015-11-05 NOTE — ED Notes (Signed)
MD informed of Pt's pain - MD waiting for test results before prescribing medication. Patient informed

## 2015-11-05 NOTE — Discharge Instructions (Signed)
Biliary Colic Biliary colic is a pain in the upper abdomen. The pain:  Is usually felt on the right side of the abdomen, but it may also be felt in the center of the abdomen, just below the breastbone (sternum).  May spread back toward the right shoulder blade.  May be steady or irregular.  May be accompanied by nausea and vomiting. Most of the time, the pain goes away in 1-5 hours. After the most intense pain passes, the abdomen may continue to ache mildly for about 24 hours. Biliary colic is caused by a blockage in the bile duct. The bile duct is a pathway that carries bile--a liquid that helps to digest fats--from the gallbladder to the small intestine. Biliary colic usually occurs after eating, when the digestive system demands bile. The pain develops when muscle cells contract forcefully to try to move the blockage so that bile can get by. HOME CARE INSTRUCTIONS  Take medicines only as directed by your health care provider.  Drink enough fluid to keep your urine clear or pale yellow.  Avoid fatty, greasy, and fried foods. These kinds of foods increase your body's demand for bile.  Avoid any foods that make your pain worse.  Avoid overeating.  Avoid having a large meal after fasting. SEEK MEDICAL CARE IF:  You develop a fever.  Your pain gets worse.  You vomit.  You develop nausea that prevents you from eating and drinking. SEEK IMMEDIATE MEDICAL CARE IF:  You suddenly develop a fever and shaking chills.  You develop a yellowish discoloration (jaundice) of:  Skin.  Whites of the eyes.  Mucous membranes.  You have continuous or severe pain that is not relieved with medicines.  You have nausea and vomiting that is not relieved with medicines.  You develop dizziness or you faint.   This information is not intended to replace advice given to you by your health care provider. Make sure you discuss any questions you have with your health care provider.   Document  Released: 11/12/2005 Document Revised: 10/26/2014 Document Reviewed: 03/23/2014 Elsevier Interactive Patient Education [employer]2016 [employer]  Please return immediately if condition worsens. Please contact her primary physician or the physician you were given for referral. If you have any specialist physicians involved in her treatment and plan please also contact them. Thank you for using [facility]. Return especially for uncontrolled pain, fever, persistent vomiting, chest pain, productive cough, increased shortness of breath, or any other new concerns

## 2015-11-05 NOTE — ED Notes (Signed)
  Reviewed d/c instructions, follow-up care, and prescriptions with pt. Pt verbalized understanding 

## 2016-11-07 ENCOUNTER — Encounter: Payer: Self-pay | Admitting: Gynecology

## 2017-06-10 IMAGING — US US ABDOMEN LIMITED
1 series · 14 of 25 positions shown · non-contrast
Comparison: Ultrasound 06/20/2010

CLINICAL DATA: Upper abdominal pain

EXAM:
US ABDOMEN LIMITED - RIGHT UPPER QUADRANT

[Series 1: us abdomen limited · 0.19mm/px · 14 of 44 slices shown]
[im 1/44]
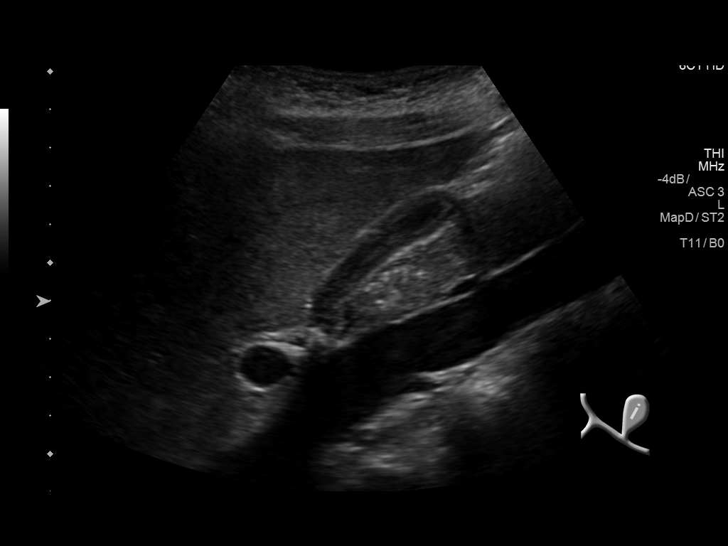
[im 4/44]
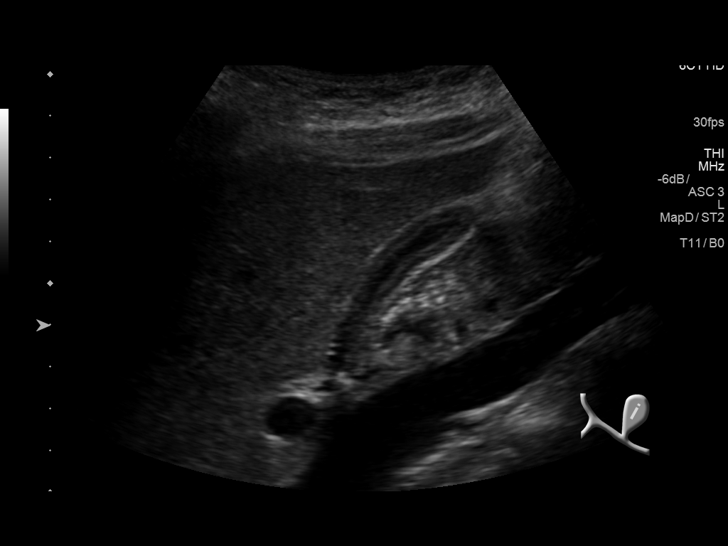
[im 8/44]
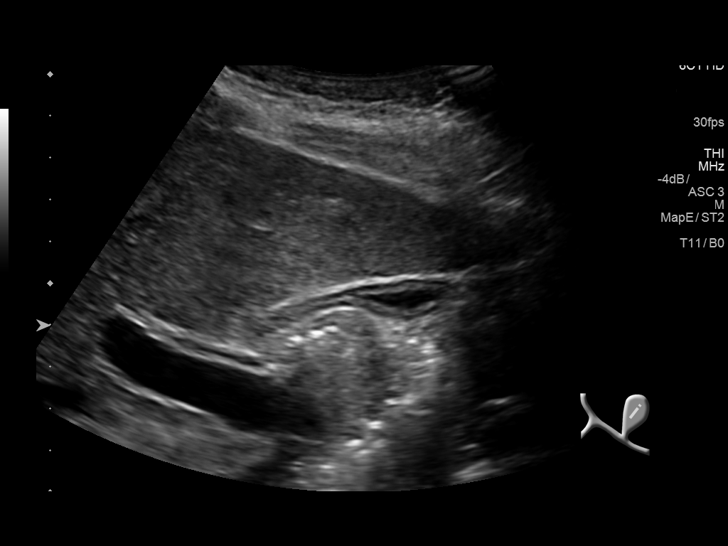
[im 11/44]
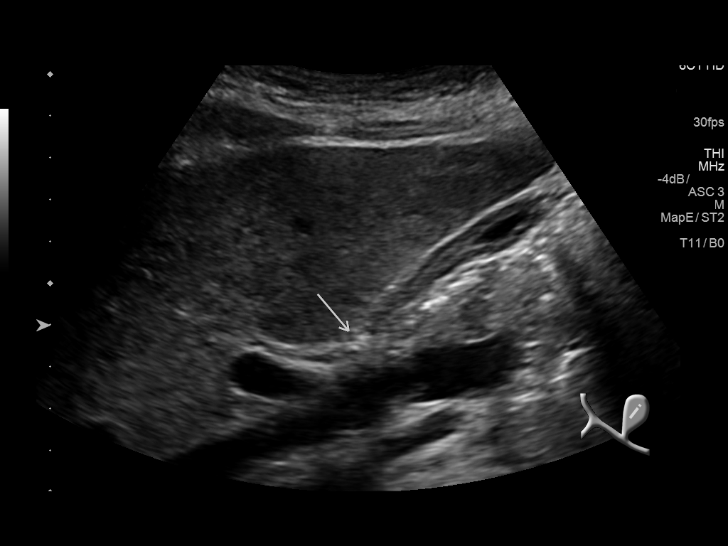
[im 15/44]
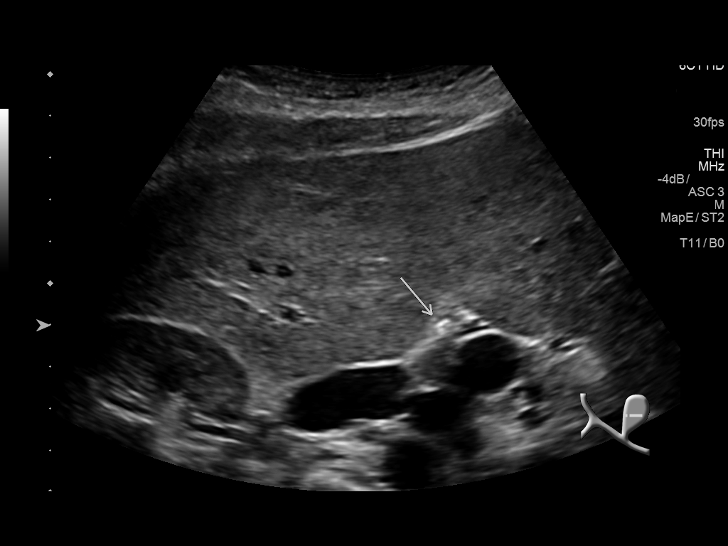
[im 17/44]
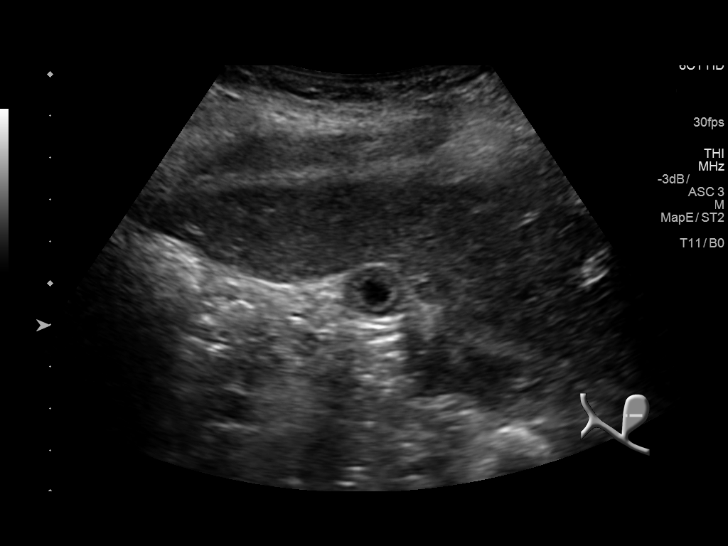
[im 20/44]
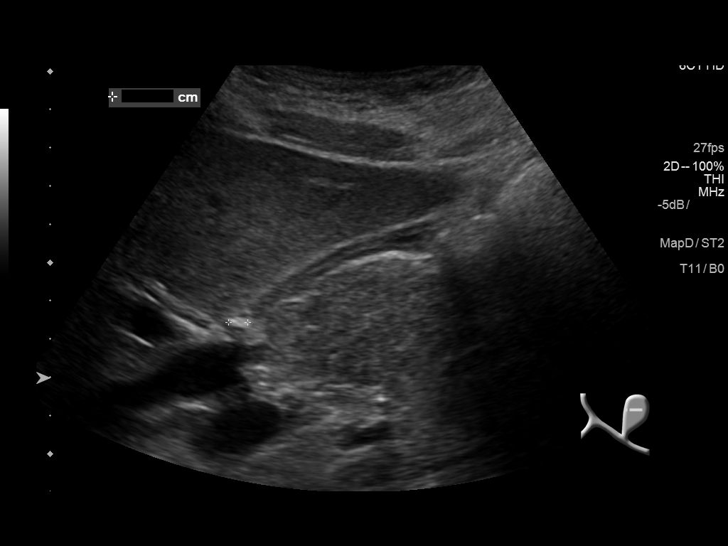
[im 24/44]
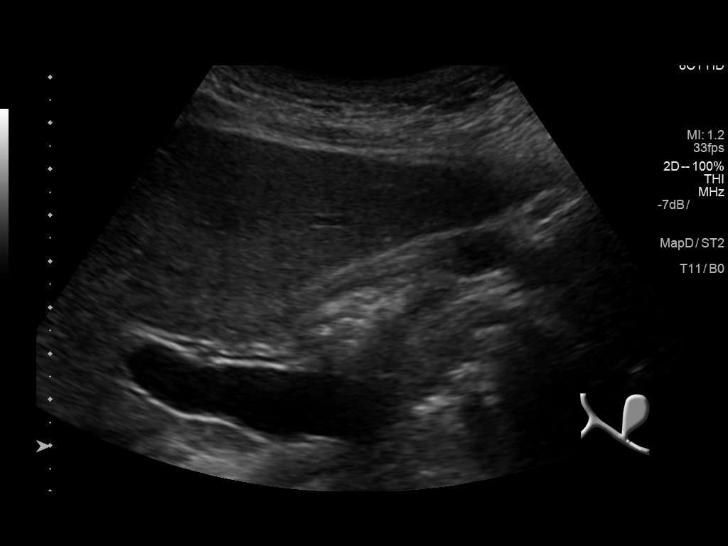
[im 27/44]
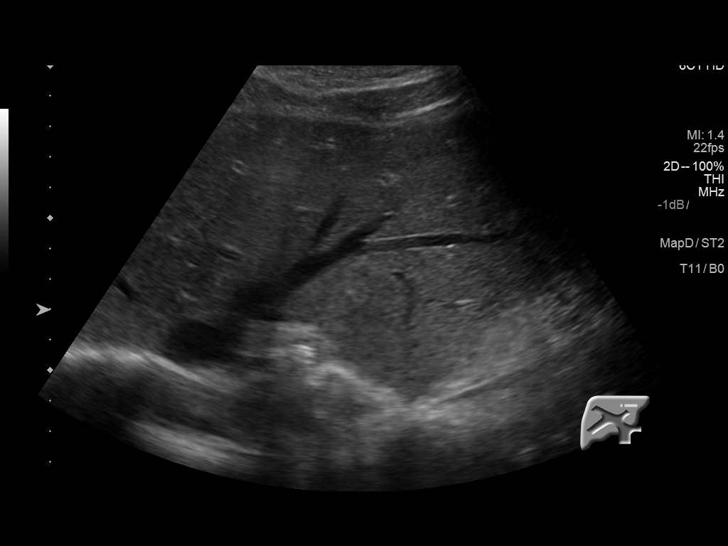
[im 29/44]
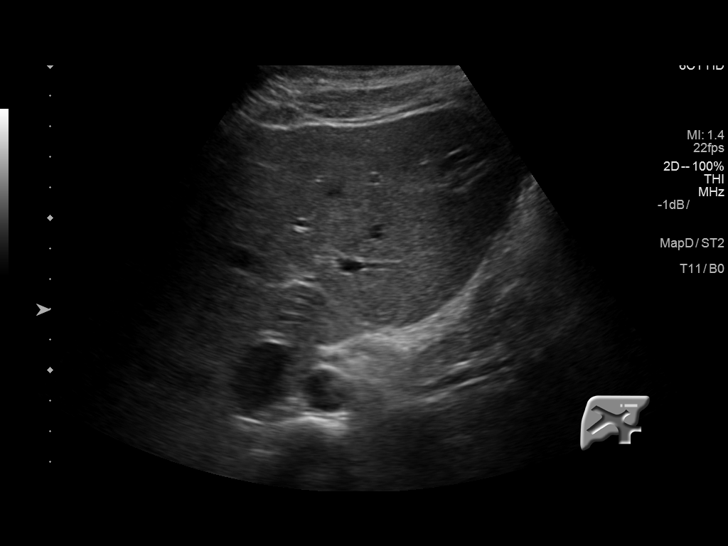
[im 33/44]
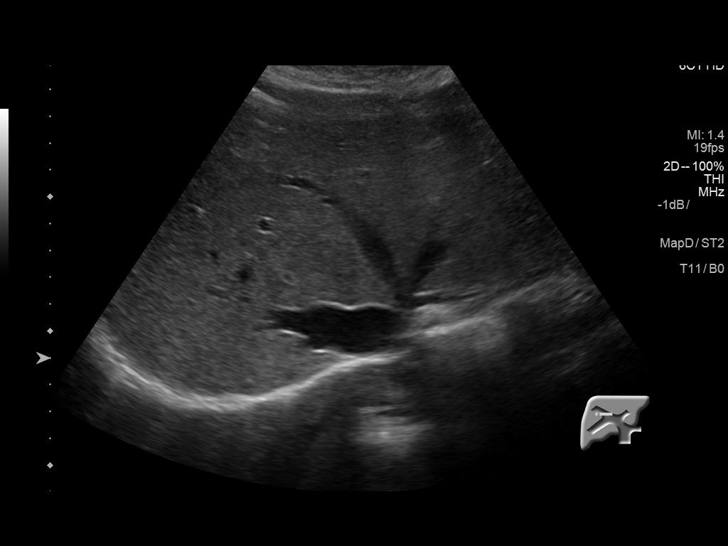
[im 36/44]
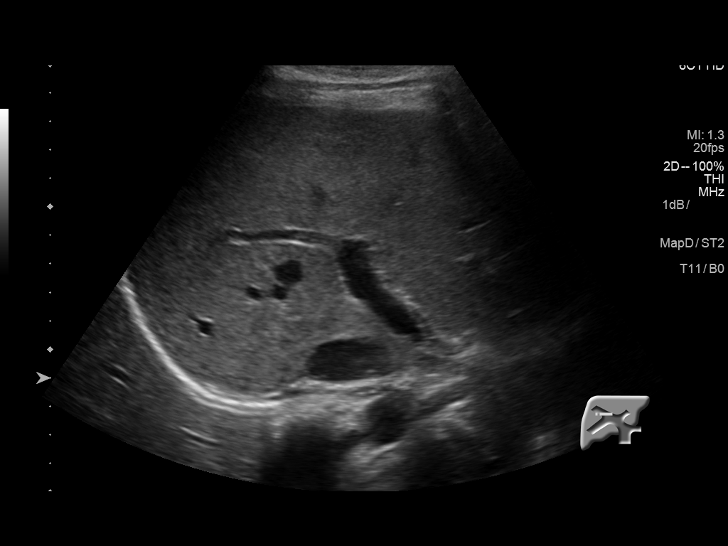
[im 40/44]
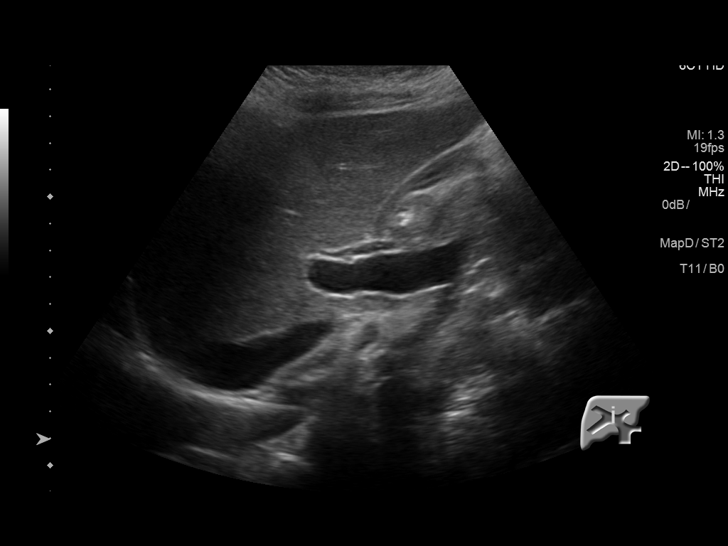
[im 44/44]
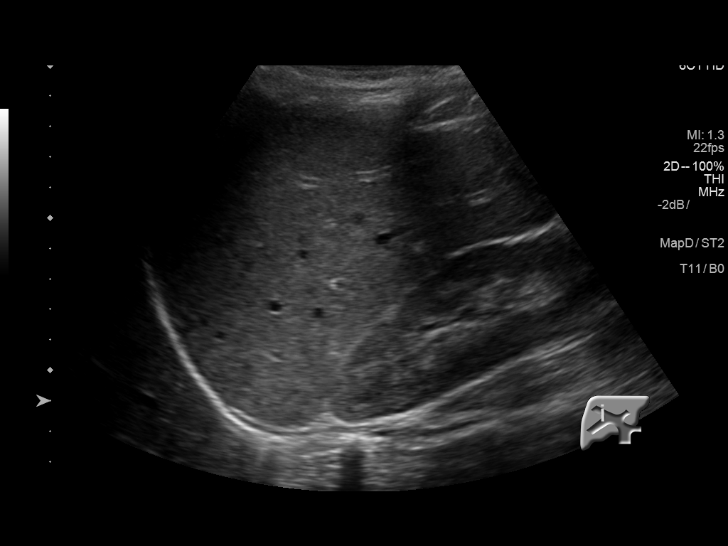

[14 of 25 positions shown; findings below may reference images not displayed]

FINDINGS: Gallbladder:

Contracted gallbladder. Gallstone in the neck of the gallbladder.
Multiple gallstones are seen on the prior ultrasound 7266.
Gallbladder wall thickened due to lack of distention.

Common bile duct:

Diameter: 1.2 mm

Liver:

No liver lesions. Hyperechoic lesion left lobe liver seen previously
no longer visualized. This may be due to different scanning
conditions. No free fluid in the right upper quadrant
IMPRESSION: Contracted gallbladder. Gallstone is present in the gallbladder
neck. Multiple small stones seen on the prior ultrasound.

Hyperechoic liver lesion is no longer visualized on today's study.

## 2020-01-09 DIAGNOSIS — B86 Scabies: Secondary | ICD-10-CM | POA: Diagnosis not present

## 2021-04-12 DIAGNOSIS — R051 Acute cough: Secondary | ICD-10-CM | POA: Diagnosis not present

## 2022-09-01 DIAGNOSIS — F900 Attention-deficit hyperactivity disorder, predominantly inattentive type: Secondary | ICD-10-CM | POA: Diagnosis not present

## 2022-09-01 DIAGNOSIS — F410 Panic disorder [episodic paroxysmal anxiety] without agoraphobia: Secondary | ICD-10-CM | POA: Diagnosis not present
